# Patient Record
Sex: Male | Born: 1935 | Race: White | Hispanic: No | Marital: Married | Smoking: Current every day smoker
Health system: Southern US, Community
[De-identification: ages and names within clinical notes are randomized; demographics above are authoritative.]

## PROBLEM LIST (undated history)

## (undated) DIAGNOSIS — K635 Polyp of colon: Secondary | ICD-10-CM

## (undated) DIAGNOSIS — I4891 Unspecified atrial fibrillation: Secondary | ICD-10-CM

## (undated) DIAGNOSIS — E0590 Thyrotoxicosis, unspecified without thyrotoxic crisis or storm: Secondary | ICD-10-CM

## (undated) DIAGNOSIS — E785 Hyperlipidemia, unspecified: Secondary | ICD-10-CM

## (undated) DIAGNOSIS — Z720 Tobacco use: Secondary | ICD-10-CM

## (undated) DIAGNOSIS — Z945 Skin transplant status: Secondary | ICD-10-CM

## (undated) DIAGNOSIS — I2510 Atherosclerotic heart disease of native coronary artery without angina pectoris: Secondary | ICD-10-CM

## (undated) DIAGNOSIS — K5790 Diverticulosis of intestine, part unspecified, without perforation or abscess without bleeding: Secondary | ICD-10-CM

## (undated) DIAGNOSIS — C259 Malignant neoplasm of pancreas, unspecified: Principal | ICD-10-CM

## (undated) DIAGNOSIS — Z950 Presence of cardiac pacemaker: Secondary | ICD-10-CM

## (undated) HISTORY — PX: CERVICAL DISC SURGERY: SHX588

## (undated) HISTORY — DX: Thyrotoxicosis, unspecified without thyrotoxic crisis or storm: E05.90

## (undated) HISTORY — DX: Malignant neoplasm of pancreas, unspecified: C25.9

## (undated) HISTORY — DX: Presence of cardiac pacemaker: Z95.0

## (undated) HISTORY — DX: Hyperlipidemia, unspecified: E78.5

## (undated) HISTORY — DX: Diverticulosis of intestine, part unspecified, without perforation or abscess without bleeding: K57.90

## (undated) HISTORY — DX: Polyp of colon: K63.5

## (undated) HISTORY — PX: LUMBAR DISC SURGERY: SHX700

## (undated) HISTORY — DX: Atherosclerotic heart disease of native coronary artery without angina pectoris: I25.10

## (undated) HISTORY — DX: Tobacco use: Z72.0

## (undated) HISTORY — DX: Unspecified atrial fibrillation: I48.91

## (undated) HISTORY — DX: Skin transplant status: Z94.5

---

## 1998-04-09 ENCOUNTER — Ambulatory Visit (HOSPITAL_COMMUNITY): Admission: RE | Admit: 1998-04-09 | Discharge: 1998-04-10 | Payer: Self-pay

## 1998-04-26 ENCOUNTER — Inpatient Hospital Stay (HOSPITAL_COMMUNITY): Admission: AD | Admit: 1998-04-26 | Discharge: 1998-04-30 | Payer: Self-pay

## 1998-04-26 ENCOUNTER — Ambulatory Visit (HOSPITAL_COMMUNITY): Admission: RE | Admit: 1998-04-26 | Discharge: 1998-04-26 | Payer: Self-pay

## 1998-05-07 ENCOUNTER — Inpatient Hospital Stay (HOSPITAL_COMMUNITY): Admission: RE | Admit: 1998-05-07 | Discharge: 1998-05-14 | Payer: Self-pay

## 1999-02-28 ENCOUNTER — Emergency Department (HOSPITAL_COMMUNITY): Admission: EM | Admit: 1999-02-28 | Discharge: 1999-02-28 | Payer: Self-pay | Admitting: Emergency Medicine

## 1999-02-28 ENCOUNTER — Encounter: Payer: Self-pay | Admitting: Emergency Medicine

## 2001-01-03 ENCOUNTER — Encounter: Payer: Self-pay | Admitting: Internal Medicine

## 2001-01-04 ENCOUNTER — Inpatient Hospital Stay (HOSPITAL_COMMUNITY): Admission: RE | Admit: 2001-01-04 | Discharge: 2001-01-05 | Payer: Self-pay | Admitting: Internal Medicine

## 2001-01-04 ENCOUNTER — Encounter: Payer: Self-pay | Admitting: Internal Medicine

## 2001-01-05 ENCOUNTER — Encounter: Payer: Self-pay | Admitting: Internal Medicine

## 2001-10-21 ENCOUNTER — Encounter: Admission: RE | Admit: 2001-10-21 | Discharge: 2001-10-21 | Payer: Self-pay | Admitting: Family Medicine

## 2001-10-21 ENCOUNTER — Encounter: Payer: Self-pay | Admitting: Family Medicine

## 2002-08-02 ENCOUNTER — Encounter: Admission: RE | Admit: 2002-08-02 | Discharge: 2002-08-02 | Payer: Self-pay | Admitting: Family Medicine

## 2002-08-02 ENCOUNTER — Encounter: Payer: Self-pay | Admitting: Family Medicine

## 2003-01-05 ENCOUNTER — Encounter: Payer: Self-pay | Admitting: Family Medicine

## 2003-01-05 ENCOUNTER — Encounter: Admission: RE | Admit: 2003-01-05 | Discharge: 2003-01-05 | Payer: Self-pay | Admitting: Family Medicine

## 2004-11-11 ENCOUNTER — Ambulatory Visit: Payer: Self-pay | Admitting: Family Medicine

## 2004-11-21 ENCOUNTER — Ambulatory Visit: Payer: Self-pay

## 2004-12-17 ENCOUNTER — Ambulatory Visit: Payer: Self-pay | Admitting: Family Medicine

## 2004-12-30 ENCOUNTER — Encounter: Admission: RE | Admit: 2004-12-30 | Discharge: 2004-12-30 | Payer: Self-pay | Admitting: Family Medicine

## 2005-01-10 ENCOUNTER — Ambulatory Visit: Payer: Self-pay | Admitting: Internal Medicine

## 2005-01-13 ENCOUNTER — Ambulatory Visit: Payer: Self-pay

## 2005-01-16 ENCOUNTER — Ambulatory Visit: Payer: Self-pay | Admitting: Family Medicine

## 2005-02-18 ENCOUNTER — Ambulatory Visit: Payer: Self-pay | Admitting: Internal Medicine

## 2005-03-23 ENCOUNTER — Ambulatory Visit: Payer: Self-pay | Admitting: Internal Medicine

## 2005-04-24 ENCOUNTER — Ambulatory Visit: Payer: Self-pay | Admitting: Internal Medicine

## 2005-05-20 ENCOUNTER — Ambulatory Visit: Payer: Self-pay | Admitting: Internal Medicine

## 2005-06-22 ENCOUNTER — Ambulatory Visit: Payer: Self-pay | Admitting: Internal Medicine

## 2005-07-22 ENCOUNTER — Ambulatory Visit: Payer: Self-pay | Admitting: Internal Medicine

## 2005-08-11 ENCOUNTER — Ambulatory Visit: Payer: Self-pay | Admitting: Family Medicine

## 2005-08-13 ENCOUNTER — Ambulatory Visit: Payer: Self-pay | Admitting: Family Medicine

## 2005-08-27 ENCOUNTER — Ambulatory Visit: Payer: Self-pay | Admitting: Internal Medicine

## 2005-09-29 ENCOUNTER — Ambulatory Visit: Payer: Self-pay | Admitting: Internal Medicine

## 2005-10-17 ENCOUNTER — Ambulatory Visit: Payer: Self-pay | Admitting: Family Medicine

## 2005-11-04 ENCOUNTER — Ambulatory Visit: Payer: Self-pay | Admitting: Internal Medicine

## 2005-12-08 ENCOUNTER — Ambulatory Visit: Payer: Self-pay | Admitting: Internal Medicine

## 2005-12-21 ENCOUNTER — Ambulatory Visit: Payer: Self-pay | Admitting: Family Medicine

## 2006-01-07 ENCOUNTER — Ambulatory Visit: Payer: Self-pay | Admitting: Family Medicine

## 2006-01-11 ENCOUNTER — Ambulatory Visit: Payer: Self-pay | Admitting: Internal Medicine

## 2006-02-11 ENCOUNTER — Ambulatory Visit: Payer: Self-pay | Admitting: Internal Medicine

## 2006-03-18 ENCOUNTER — Ambulatory Visit: Payer: Self-pay | Admitting: Internal Medicine

## 2006-04-28 ENCOUNTER — Ambulatory Visit: Payer: Self-pay | Admitting: Family Medicine

## 2006-04-28 ENCOUNTER — Ambulatory Visit: Payer: Self-pay | Admitting: Internal Medicine

## 2006-05-24 ENCOUNTER — Ambulatory Visit: Payer: Self-pay

## 2006-06-25 ENCOUNTER — Ambulatory Visit: Payer: Self-pay | Admitting: Internal Medicine

## 2006-08-27 ENCOUNTER — Ambulatory Visit: Payer: Self-pay | Admitting: Internal Medicine

## 2006-09-27 ENCOUNTER — Ambulatory Visit: Payer: Self-pay | Admitting: Internal Medicine

## 2006-10-27 ENCOUNTER — Ambulatory Visit: Payer: Self-pay | Admitting: Family Medicine

## 2006-11-01 ENCOUNTER — Ambulatory Visit: Payer: Self-pay | Admitting: Internal Medicine

## 2006-11-22 ENCOUNTER — Ambulatory Visit: Payer: Self-pay | Admitting: Internal Medicine

## 2006-12-20 ENCOUNTER — Ambulatory Visit: Payer: Self-pay | Admitting: Family Medicine

## 2006-12-20 ENCOUNTER — Ambulatory Visit: Payer: Self-pay | Admitting: Internal Medicine

## 2006-12-20 ENCOUNTER — Encounter: Admission: RE | Admit: 2006-12-20 | Discharge: 2006-12-20 | Payer: Self-pay | Admitting: Family Medicine

## 2006-12-20 LAB — CONVERTED CEMR LAB
Alkaline Phosphatase: 102 units/L (ref 39–117)
BUN: 13 mg/dL (ref 6–23)
Basophils Absolute: 0.1 10*3/uL (ref 0.0–0.1)
Chol/HDL Ratio, serum: 5.4
Cholesterol: 187 mg/dL (ref 0–200)
HCT: 46.8 % (ref 39.0–52.0)
HDL: 34.5 mg/dL — ABNORMAL LOW (ref >39.0)
MCHC: 32.4 g/dL (ref 30.0–36.0)
MCV: 96.3 fL (ref 78.0–100.0)
Monocytes Relative: 10.4 % (ref 3.0–11.0)
Neutrophils Relative %: 60.2 % (ref 43.0–77.0)
PSA: 0.26 ng/mL (ref 0.10–4.00)
Platelets: 217 10*3/uL (ref 150–400)
RBC: 4.86 M/uL (ref 4.22–5.81)
Sodium: 139 meq/L (ref 135–145)
TSH: 1.25 microintl units/mL (ref 0.35–5.50)
Triglyceride fasting, serum: 61 mg/dL (ref 0–149)
Uric Acid, Serum: 6.5 mg/dL (ref 2.4–7.0)

## 2007-01-17 ENCOUNTER — Ambulatory Visit: Payer: Self-pay | Admitting: Internal Medicine

## 2007-02-15 ENCOUNTER — Ambulatory Visit: Payer: Self-pay | Admitting: Internal Medicine

## 2007-03-17 ENCOUNTER — Ambulatory Visit: Payer: Self-pay | Admitting: Internal Medicine

## 2007-03-19 ENCOUNTER — Ambulatory Visit: Payer: Self-pay | Admitting: Internal Medicine

## 2007-04-11 ENCOUNTER — Ambulatory Visit: Payer: Self-pay | Admitting: Internal Medicine

## 2007-04-13 ENCOUNTER — Ambulatory Visit: Payer: Self-pay | Admitting: Family Medicine

## 2007-04-13 ENCOUNTER — Ambulatory Visit: Payer: Self-pay | Admitting: Internal Medicine

## 2007-04-14 ENCOUNTER — Ambulatory Visit: Payer: Self-pay | Admitting: Family Medicine

## 2007-04-25 ENCOUNTER — Ambulatory Visit: Payer: Self-pay | Admitting: Family Medicine

## 2007-05-10 ENCOUNTER — Ambulatory Visit: Payer: Self-pay | Admitting: Internal Medicine

## 2007-06-06 ENCOUNTER — Ambulatory Visit: Payer: Self-pay | Admitting: Internal Medicine

## 2007-08-01 ENCOUNTER — Ambulatory Visit: Payer: Self-pay | Admitting: Internal Medicine

## 2007-08-30 ENCOUNTER — Ambulatory Visit: Payer: Self-pay | Admitting: Internal Medicine

## 2007-09-26 ENCOUNTER — Ambulatory Visit: Payer: Self-pay | Admitting: Internal Medicine

## 2007-10-05 ENCOUNTER — Ambulatory Visit: Payer: Self-pay | Admitting: Family Medicine

## 2007-10-25 ENCOUNTER — Ambulatory Visit: Payer: Self-pay | Admitting: Internal Medicine

## 2007-11-11 ENCOUNTER — Ambulatory Visit: Payer: Self-pay | Admitting: Family Medicine

## 2007-11-11 LAB — CONVERTED CEMR LAB
ALT: 13 units/L (ref 0–53)
Basophils Relative: 0.1 % (ref 0.0–1.0)
Bilirubin, Direct: 0.2 mg/dL (ref 0.0–0.3)
CO2: 28 meq/L (ref 19–32)
Eosinophils Relative: 2.1 % (ref 0.0–5.0)
GFR calc Af Amer: 85 mL/min
Glucose, Bld: 94 mg/dL (ref 70–99)
Hemoglobin: 14.1 g/dL (ref 13.0–17.0)
Ketones, urine, test strip: NEGATIVE
Lymphocytes Relative: 28.4 % (ref 12.0–46.0)
Monocytes Absolute: 1.2 10*3/uL — ABNORMAL HIGH (ref 0.2–0.7)
Neutro Abs: 6.7 10*3/uL (ref 1.4–7.7)
Nitrite: NEGATIVE
Potassium: 5 meq/L (ref 3.5–5.1)
Specific Gravity, Urine: 1.02
TSH: 1.5 microintl units/mL (ref 0.35–5.50)
Total Protein: 6.5 g/dL (ref 6.0–8.3)
VLDL: 12 mg/dL (ref 0–40)
WBC: 11.3 10*3/uL — ABNORMAL HIGH (ref 4.5–10.5)

## 2007-11-19 ENCOUNTER — Ambulatory Visit: Payer: Self-pay | Admitting: Family Medicine

## 2007-11-21 ENCOUNTER — Ambulatory Visit: Payer: Self-pay | Admitting: Family Medicine

## 2007-11-21 ENCOUNTER — Ambulatory Visit: Payer: Self-pay | Admitting: Internal Medicine

## 2007-11-21 DIAGNOSIS — E782 Mixed hyperlipidemia: Secondary | ICD-10-CM

## 2007-11-21 DIAGNOSIS — M109 Gout, unspecified: Secondary | ICD-10-CM

## 2007-11-21 DIAGNOSIS — E0590 Thyrotoxicosis, unspecified without thyrotoxic crisis or storm: Secondary | ICD-10-CM | POA: Insufficient documentation

## 2007-11-21 DIAGNOSIS — E785 Hyperlipidemia, unspecified: Secondary | ICD-10-CM | POA: Insufficient documentation

## 2007-11-21 DIAGNOSIS — F17200 Nicotine dependence, unspecified, uncomplicated: Secondary | ICD-10-CM | POA: Insufficient documentation

## 2007-11-21 DIAGNOSIS — R3129 Other microscopic hematuria: Secondary | ICD-10-CM

## 2007-12-19 ENCOUNTER — Ambulatory Visit: Payer: Self-pay | Admitting: Internal Medicine

## 2008-03-22 ENCOUNTER — Ambulatory Visit: Payer: Self-pay | Admitting: Internal Medicine

## 2008-06-06 DIAGNOSIS — M5137 Other intervertebral disc degeneration, lumbosacral region: Secondary | ICD-10-CM

## 2008-06-11 ENCOUNTER — Ambulatory Visit: Payer: Self-pay | Admitting: Family Medicine

## 2008-06-14 ENCOUNTER — Ambulatory Visit: Payer: Self-pay | Admitting: Family Medicine

## 2008-08-15 ENCOUNTER — Ambulatory Visit: Payer: Self-pay

## 2008-09-12 ENCOUNTER — Ambulatory Visit: Payer: Self-pay | Admitting: Family Medicine

## 2008-11-13 ENCOUNTER — Ambulatory Visit: Payer: Self-pay | Admitting: Internal Medicine

## 2008-12-10 ENCOUNTER — Ambulatory Visit: Payer: Self-pay | Admitting: Family Medicine

## 2008-12-10 LAB — CONVERTED CEMR LAB
ALT: 11 units/L (ref 0–53)
AST: 23 units/L (ref 0–37)
Albumin: 3.6 g/dL (ref 3.5–5.2)
Alkaline Phosphatase: 90 units/L (ref 39–117)
BUN: 8 mg/dL (ref 6–23)
Basophils Relative: 0.5 % (ref 0.0–3.0)
CO2: 31 meq/L (ref 19–32)
Chloride: 110 meq/L (ref 96–112)
Creatinine, Ser: 1.1 mg/dL (ref 0.4–1.5)
Eosinophils Relative: 1.7 % (ref 0.0–5.0)
Glucose, Bld: 102 mg/dL — ABNORMAL HIGH (ref 70–99)
Glucose, Urine, Semiquant: NEGATIVE
Lymphocytes Relative: 33 % (ref 12.0–46.0)
MCV: 96.6 fL (ref 78.0–100.0)
Monocytes Relative: 10.9 % (ref 3.0–12.0)
Neutrophils Relative %: 53.9 % (ref 43.0–77.0)
Nitrite: NEGATIVE
PSA: 0.23 ng/mL (ref 0.10–4.00)
Potassium: 4.7 meq/L (ref 3.5–5.1)
Protein, U semiquant: NEGATIVE
RBC: 4.27 M/uL (ref 4.22–5.81)
Total CHOL/HDL Ratio: 3.9
VLDL: 8 mg/dL (ref 0–40)
WBC: 8.9 10*3/uL (ref 4.5–10.5)
pH: 7

## 2008-12-20 ENCOUNTER — Encounter: Payer: Self-pay | Admitting: Internal Medicine

## 2008-12-20 ENCOUNTER — Ambulatory Visit: Payer: Self-pay | Admitting: Internal Medicine

## 2008-12-28 ENCOUNTER — Ambulatory Visit: Payer: Self-pay | Admitting: Family Medicine

## 2008-12-31 ENCOUNTER — Ambulatory Visit: Payer: Self-pay | Admitting: Family Medicine

## 2009-01-03 ENCOUNTER — Ambulatory Visit: Payer: Self-pay | Admitting: Family Medicine

## 2009-02-20 ENCOUNTER — Ambulatory Visit: Payer: Self-pay | Admitting: Internal Medicine

## 2009-02-25 DIAGNOSIS — J069 Acute upper respiratory infection, unspecified: Secondary | ICD-10-CM | POA: Insufficient documentation

## 2009-03-04 ENCOUNTER — Ambulatory Visit: Payer: Self-pay | Admitting: Family Medicine

## 2009-03-04 DIAGNOSIS — I959 Hypotension, unspecified: Secondary | ICD-10-CM | POA: Insufficient documentation

## 2009-03-12 ENCOUNTER — Encounter: Payer: Self-pay | Admitting: Internal Medicine

## 2009-03-19 ENCOUNTER — Encounter: Payer: Self-pay | Admitting: Internal Medicine

## 2009-03-19 ENCOUNTER — Ambulatory Visit: Payer: Self-pay | Admitting: Internal Medicine

## 2009-03-19 DIAGNOSIS — Z950 Presence of cardiac pacemaker: Secondary | ICD-10-CM

## 2009-03-19 DIAGNOSIS — I442 Atrioventricular block, complete: Secondary | ICD-10-CM

## 2009-03-19 DIAGNOSIS — I4891 Unspecified atrial fibrillation: Secondary | ICD-10-CM

## 2009-04-01 ENCOUNTER — Ambulatory Visit: Payer: Self-pay | Admitting: Family Medicine

## 2009-05-22 ENCOUNTER — Ambulatory Visit: Payer: Self-pay | Admitting: Internal Medicine

## 2009-08-21 ENCOUNTER — Ambulatory Visit: Payer: Self-pay | Admitting: Internal Medicine

## 2009-09-02 DIAGNOSIS — R109 Unspecified abdominal pain: Secondary | ICD-10-CM | POA: Insufficient documentation

## 2009-09-03 ENCOUNTER — Inpatient Hospital Stay (HOSPITAL_COMMUNITY): Admission: EM | Admit: 2009-09-03 | Discharge: 2009-09-07 | Payer: Self-pay | Admitting: Emergency Medicine

## 2009-09-03 ENCOUNTER — Ambulatory Visit: Payer: Self-pay | Admitting: Family Medicine

## 2009-09-25 ENCOUNTER — Encounter (INDEPENDENT_AMBULATORY_CARE_PROVIDER_SITE_OTHER): Payer: Self-pay | Admitting: *Deleted

## 2009-10-07 ENCOUNTER — Ambulatory Visit: Payer: Self-pay

## 2009-10-07 ENCOUNTER — Encounter: Payer: Self-pay | Admitting: Internal Medicine

## 2009-10-09 ENCOUNTER — Ambulatory Visit: Payer: Self-pay | Admitting: Family Medicine

## 2009-11-01 ENCOUNTER — Ambulatory Visit: Payer: Self-pay | Admitting: Family Medicine

## 2009-11-01 ENCOUNTER — Encounter: Admission: RE | Admit: 2009-11-01 | Discharge: 2009-11-01 | Payer: Self-pay | Admitting: Family Medicine

## 2009-11-01 DIAGNOSIS — J449 Chronic obstructive pulmonary disease, unspecified: Secondary | ICD-10-CM

## 2009-11-01 DIAGNOSIS — J4489 Other specified chronic obstructive pulmonary disease: Secondary | ICD-10-CM | POA: Insufficient documentation

## 2009-11-05 ENCOUNTER — Ambulatory Visit: Payer: Self-pay | Admitting: Family Medicine

## 2009-11-20 ENCOUNTER — Ambulatory Visit: Payer: Self-pay | Admitting: Internal Medicine

## 2009-11-26 ENCOUNTER — Ambulatory Visit: Payer: Self-pay | Admitting: Internal Medicine

## 2009-11-26 ENCOUNTER — Ambulatory Visit (HOSPITAL_COMMUNITY): Admission: RE | Admit: 2009-11-26 | Discharge: 2009-11-26 | Payer: Self-pay | Admitting: Family Medicine

## 2009-11-26 DIAGNOSIS — R079 Chest pain, unspecified: Secondary | ICD-10-CM | POA: Insufficient documentation

## 2009-11-27 ENCOUNTER — Telehealth: Payer: Self-pay | Admitting: Family Medicine

## 2009-11-27 ENCOUNTER — Ambulatory Visit: Payer: Self-pay | Admitting: Family Medicine

## 2009-11-27 ENCOUNTER — Telehealth (INDEPENDENT_AMBULATORY_CARE_PROVIDER_SITE_OTHER): Payer: Self-pay | Admitting: *Deleted

## 2009-12-02 ENCOUNTER — Ambulatory Visit: Payer: Self-pay

## 2009-12-02 ENCOUNTER — Ambulatory Visit: Payer: Self-pay | Admitting: Internal Medicine

## 2009-12-02 ENCOUNTER — Encounter (HOSPITAL_COMMUNITY): Admission: RE | Admit: 2009-12-02 | Discharge: 2010-02-17 | Payer: Self-pay | Admitting: Internal Medicine

## 2009-12-03 ENCOUNTER — Ambulatory Visit: Payer: Self-pay | Admitting: Family Medicine

## 2009-12-17 ENCOUNTER — Telehealth: Payer: Self-pay | Admitting: Internal Medicine

## 2009-12-17 ENCOUNTER — Ambulatory Visit: Payer: Self-pay | Admitting: Family Medicine

## 2009-12-17 LAB — CONVERTED CEMR LAB
Albumin: 3.8 g/dL (ref 3.5–5.2)
Basophils Absolute: 0.1 10*3/uL (ref 0.0–0.1)
CO2: 31 meq/L (ref 19–32)
Calcium: 9.3 mg/dL (ref 8.4–10.5)
Cholesterol: 139 mg/dL (ref 0–200)
Eosinophils Absolute: 0.2 10*3/uL (ref 0.0–0.7)
Glucose, Bld: 90 mg/dL (ref 70–99)
HCT: 42.5 % (ref 39.0–52.0)
Hemoglobin: 14 g/dL (ref 13.0–17.0)
LDL Cholesterol: 76 mg/dL (ref 0–99)
Lymphocytes Relative: 29.2 % (ref 12.0–46.0)
Lymphs Abs: 3.2 10*3/uL (ref 0.7–4.0)
MCHC: 32.8 g/dL (ref 30.0–36.0)
Monocytes Absolute: 1 10*3/uL (ref 0.1–1.0)
Neutro Abs: 6.3 10*3/uL (ref 1.4–7.7)
Nitrite: NEGATIVE
Protein, U semiquant: NEGATIVE
RDW: 14.1 % (ref 11.5–14.6)
Sodium: 142 meq/L (ref 135–145)
TSH: 1.21 microintl units/mL (ref 0.35–5.50)
Triglycerides: 81 mg/dL (ref 0.0–149.0)
Urobilinogen, UA: 0.2
VLDL: 16.2 mg/dL (ref 0.0–40.0)

## 2009-12-26 ENCOUNTER — Ambulatory Visit: Payer: Self-pay | Admitting: Internal Medicine

## 2009-12-26 LAB — CONVERTED CEMR LAB: POC INR: 1.1

## 2009-12-30 ENCOUNTER — Telehealth: Payer: Self-pay | Admitting: Family Medicine

## 2009-12-30 ENCOUNTER — Ambulatory Visit: Payer: Self-pay | Admitting: Cardiology

## 2010-01-06 ENCOUNTER — Ambulatory Visit: Payer: Self-pay | Admitting: Cardiology

## 2010-01-13 ENCOUNTER — Ambulatory Visit: Payer: Self-pay | Admitting: Cardiology

## 2010-01-20 ENCOUNTER — Ambulatory Visit: Payer: Self-pay | Admitting: Cardiovascular Disease

## 2010-01-30 ENCOUNTER — Ambulatory Visit: Payer: Self-pay | Admitting: Cardiology

## 2010-02-13 ENCOUNTER — Ambulatory Visit: Payer: Self-pay | Admitting: Cardiology

## 2010-02-19 ENCOUNTER — Ambulatory Visit: Payer: Self-pay | Admitting: Internal Medicine

## 2010-03-06 ENCOUNTER — Ambulatory Visit: Payer: Self-pay | Admitting: Cardiology

## 2010-03-06 LAB — CONVERTED CEMR LAB: POC INR: 1.9

## 2010-03-07 ENCOUNTER — Telehealth: Payer: Self-pay | Admitting: Family Medicine

## 2010-03-27 ENCOUNTER — Ambulatory Visit: Payer: Self-pay | Admitting: Internal Medicine

## 2010-03-27 LAB — CONVERTED CEMR LAB: POC INR: 2.5

## 2010-04-11 ENCOUNTER — Telehealth: Payer: Self-pay | Admitting: Family Medicine

## 2010-04-21 ENCOUNTER — Telehealth: Payer: Self-pay | Admitting: Family Medicine

## 2010-04-24 ENCOUNTER — Ambulatory Visit: Payer: Self-pay | Admitting: Internal Medicine

## 2010-05-15 ENCOUNTER — Ambulatory Visit: Payer: Self-pay | Admitting: Internal Medicine

## 2010-05-15 LAB — CONVERTED CEMR LAB: POC INR: 3

## 2010-05-21 ENCOUNTER — Ambulatory Visit: Payer: Self-pay | Admitting: Internal Medicine

## 2010-06-12 ENCOUNTER — Ambulatory Visit: Payer: Self-pay | Admitting: Cardiology

## 2010-06-12 LAB — CONVERTED CEMR LAB: POC INR: 2.4

## 2010-07-10 ENCOUNTER — Ambulatory Visit: Payer: Self-pay | Admitting: Internal Medicine

## 2010-08-01 ENCOUNTER — Telehealth: Payer: Self-pay | Admitting: Family Medicine

## 2010-08-07 ENCOUNTER — Ambulatory Visit: Payer: Self-pay | Admitting: Internal Medicine

## 2010-08-12 ENCOUNTER — Telehealth: Payer: Self-pay | Admitting: Family Medicine

## 2010-08-20 ENCOUNTER — Ambulatory Visit: Payer: Self-pay | Admitting: Internal Medicine

## 2010-09-02 ENCOUNTER — Ambulatory Visit: Payer: Self-pay | Admitting: Family Medicine

## 2010-09-08 ENCOUNTER — Ambulatory Visit: Payer: Self-pay | Admitting: Cardiovascular Disease

## 2010-10-06 ENCOUNTER — Ambulatory Visit: Payer: Self-pay | Admitting: Internal Medicine

## 2010-10-06 LAB — CONVERTED CEMR LAB: POC INR: 3.4

## 2010-10-21 ENCOUNTER — Ambulatory Visit: Payer: Self-pay | Admitting: Internal Medicine

## 2010-11-18 ENCOUNTER — Ambulatory Visit: Payer: Self-pay | Admitting: Cardiovascular Disease

## 2010-11-18 LAB — CONVERTED CEMR LAB: POC INR: 2.9

## 2010-11-19 ENCOUNTER — Ambulatory Visit: Payer: Self-pay | Admitting: Internal Medicine

## 2010-12-03 ENCOUNTER — Telehealth: Payer: Self-pay | Admitting: Family Medicine

## 2010-12-16 ENCOUNTER — Ambulatory Visit: Admission: RE | Admit: 2010-12-16 | Discharge: 2010-12-16 | Payer: Self-pay | Source: Home / Self Care

## 2010-12-16 ENCOUNTER — Telehealth: Payer: Self-pay | Admitting: Family Medicine

## 2010-12-16 LAB — CONVERTED CEMR LAB: POC INR: 2.8

## 2010-12-18 ENCOUNTER — Other Ambulatory Visit: Payer: Self-pay | Admitting: Family Medicine

## 2010-12-18 ENCOUNTER — Ambulatory Visit
Admission: RE | Admit: 2010-12-18 | Discharge: 2010-12-18 | Payer: Self-pay | Source: Home / Self Care | Attending: Family Medicine | Admitting: Family Medicine

## 2010-12-18 LAB — BASIC METABOLIC PANEL
BUN: 14 mg/dL (ref 6–23)
CO2: 28 mEq/L (ref 19–32)
Calcium: 9 mg/dL (ref 8.4–10.5)
Chloride: 101 mEq/L (ref 96–112)
Creatinine, Ser: 1 mg/dL (ref 0.4–1.5)
GFR: 77.75 mL/min (ref >60.00)
Glucose, Bld: 91 mg/dL (ref 70–99)
Potassium: 4.2 mEq/L (ref 3.5–5.1)
Sodium: 140 mEq/L (ref 135–145)

## 2010-12-18 LAB — CBC WITH DIFFERENTIAL/PLATELET
Basophils Absolute: 0.1 10*3/uL (ref 0.0–0.1)
Basophils Relative: 0.8 % (ref 0.0–3.0)
Eosinophils Absolute: 0.2 10*3/uL (ref 0.0–0.7)
Eosinophils Relative: 2.3 % (ref 0.0–5.0)
HCT: 40.9 % (ref 39.0–52.0)
Hemoglobin: 13.6 g/dL (ref 13.0–17.0)
Lymphocytes Relative: 34.8 % (ref 12.0–46.0)
Lymphs Abs: 2.7 10*3/uL (ref 0.7–4.0)
MCHC: 33.1 g/dL (ref 30.0–36.0)
MCV: 98.9 fl (ref 78.0–100.0)
Monocytes Absolute: 1 10*3/uL (ref 0.1–1.0)
Monocytes Relative: 13 % — ABNORMAL HIGH (ref 3.0–12.0)
Neutro Abs: 3.8 10*3/uL (ref 1.4–7.7)
Neutrophils Relative %: 49.1 % (ref 43.0–77.0)
Platelets: 191 10*3/uL (ref 150.0–400.0)
RBC: 4.14 Mil/uL — ABNORMAL LOW (ref 4.22–5.81)
RDW: 14.9 % — ABNORMAL HIGH (ref 11.5–14.6)
WBC: 7.8 10*3/uL (ref 4.5–10.5)

## 2010-12-18 LAB — HEPATIC FUNCTION PANEL
ALT: 30 U/L (ref 0–53)
AST: 37 U/L (ref 0–37)
Albumin: 3.7 g/dL (ref 3.5–5.2)
Alkaline Phosphatase: 101 U/L (ref 39–117)
Bilirubin, Direct: 0.2 mg/dL (ref 0.0–0.3)
Total Bilirubin: 0.6 mg/dL (ref 0.3–1.2)
Total Protein: 6.6 g/dL (ref 6.0–8.3)

## 2010-12-18 LAB — PSA: PSA: 0.39 ng/mL (ref 0.10–4.00)

## 2010-12-18 LAB — LIPID PANEL
Cholesterol: 121 mg/dL (ref 0–200)
HDL: 28.3 mg/dL — ABNORMAL LOW (ref >39.00)
LDL Cholesterol: 73 mg/dL (ref 0–99)
Total CHOL/HDL Ratio: 4
Triglycerides: 100 mg/dL (ref 0.0–149.0)
VLDL: 20 mg/dL (ref 0.0–40.0)

## 2010-12-18 LAB — URIC ACID: Uric Acid, Serum: 3.7 mg/dL — ABNORMAL LOW (ref 4.0–7.8)

## 2010-12-18 LAB — TSH: TSH: 1.76 u[IU]/mL (ref 0.35–5.50)

## 2010-12-30 ENCOUNTER — Ambulatory Visit
Admission: RE | Admit: 2010-12-30 | Discharge: 2010-12-30 | Payer: Self-pay | Source: Home / Self Care | Attending: Internal Medicine | Admitting: Internal Medicine

## 2011-01-13 NOTE — Medication Information (Signed)
Summary: rov-tp  Anticoagulant Therapy  Managed by: Shelby Dubin, PharmD, BCPS, CPP Referring MD: Kyla Balzarine MD: Jens Som MD, Arlys John Indication 1: Atrial Fibrillation Lab Used: [facility] [place] Site: [address] INR POC 1.9 INR RANGE 2-3  Dietary changes: no    Health status changes: no    Bleeding/hemorrhagic complications: no    Recent/future hospitalizations: no    Any changes in medication regimen? no    Recent/future dental: no  Any missed doses?: no       Is patient compliant with meds? yes       Allergies (verified): No Known Drug Allergies  Anticoagulation Management History:      The patient is taking warfarin and comes in today for a routine follow up visit.  Positive risk factors for bleeding include an age of 74 years or older.  The bleeding index is 'intermediate risk'.  Negative CHADS2 values include Age > 74 years old.  Anticoagulation responsible provider: Jens Som MD, Arlys John.  INR POC: 1.9.  Cuvette Lot#: [id number].  Exp: 03/2011.    Anticoagulation Management Assessment/Plan:      The patient's current anticoagulation dose is Warfarin sodium 5 mg tabs: Use as directed by Anticoagulation Clinic.  The next INR is due 01/06/2010.  Results were reviewed/authorized by Shelby Dubin, PharmD, BCPS, CPP.  He was notified by Ysidro Evert, Pharm D Candidate.         Prior Anticoagulation Instructions: Start taking warfarin as below:  Call with any questions or concerns.  Current Anticoagulation Instructions: INR: 1.9 (subtherapeutic but not at steady state) Continue with same dose of 5mg  tablet daily Recheck in 1 week

## 2011-01-13 NOTE — Medication Information (Signed)
Summary: rov/ewj  Anticoagulant Therapy  Managed by: Weston Brass, PharmD Referring MD: Kyla Balzarine MD: Shirlee Latch MD, Dalton Indication 1: Atrial Fibrillation Lab Used: [facility] [place] Site: [address] INR POC 2.4 INR RANGE 2-3  Dietary changes: no    Health status changes: no    Bleeding/hemorrhagic complications: no    Recent/future hospitalizations: no    Any changes in medication regimen? no    Recent/future dental: no  Any missed doses?: no       Is patient compliant with meds? yes       Allergies: No Known Drug Allergies  Anticoagulation Management History:      The patient is taking warfarin and comes in today for a routine follow up visit.  Positive risk factors for bleeding include an age of 23 years or older.  The bleeding index is 'intermediate risk'.  Negative CHADS2 values include Age > 11 years old.  Anticoagulation responsible provider: Shirlee Latch MD, Dalton.  INR POC: 2.4.  Cuvette Lot#: [id number].  Exp: 08/2011.    Anticoagulation Management Assessment/Plan:      The patient's current anticoagulation dose is Warfarin sodium 5 mg tabs: Use as directed by Anticoagulation Clinic.  The target INR is 2.0-3.0.  The next INR is due 07/10/2010.  Anticoagulation instructions were given to patient.  Results were reviewed/authorized by Weston Brass, PharmD.  He was notified by Weston Brass PharmD.         Prior Anticoagulation Instructions: INR 3.0  Start taking 1 tablet daily except 1/2 tablet on Mondays, Wednesdays, and Fridays.  Recheck in 4 weeks.    Current Anticoagulation Instructions: INR 2.4  Continue same dose of 1 tablet every day except 1/2 tablet on Monday, Wednesday and Friday.

## 2011-01-13 NOTE — Cardiovascular Report (Signed)
Summary: TTM   TTM   Imported By: Roderic Ovens 09/03/2010 11:50:55  _____________________________________________________________________  External Attachment:    Type:   Image     Comment:   External Document

## 2011-01-13 NOTE — Medication Information (Signed)
Summary: rov/sp  Anticoagulant Therapy  Managed by: Cloyde Reams, RN, BSN Referring MD: Kyla Balzarine MD: Graciela Husbands MD, Viviann Spare Indication 1: Atrial Fibrillation Lab Used: [facility] [place] Site: [address] INR POC 3.0 INR RANGE 2-3  Dietary changes: no    Health status changes: no    Bleeding/hemorrhagic complications: yes       Details: episodic gum bleeding after brushing teeth  Recent/future hospitalizations: no    Any changes in medication regimen? no    Recent/future dental: no  Any missed doses?: no       Is patient compliant with meds? yes       Allergies (verified): No Known Drug Allergies  Anticoagulation Management History:      The patient is taking warfarin and comes in today for a routine follow up visit.  Positive risk factors for bleeding include an age of 74 years or older.  The bleeding index is 'intermediate risk'.  Negative CHADS2 values include Age > 69 years old.  Anticoagulation responsible provider: Graciela Husbands MD, Viviann Spare.  INR POC: 3.0.  Cuvette Lot#: [id number].  Exp: 07/2011.    Anticoagulation Management Assessment/Plan:      The patient's current anticoagulation dose is Warfarin sodium 5 mg tabs: Use as directed by Anticoagulation Clinic.  The target INR is 2.0-3.0.  The next INR is due 06/12/2010.  Anticoagulation instructions were given to patient.  Results were reviewed/authorized by Cloyde Reams, RN, BSN.  He was notified by Cloyde Reams RN.         Prior Anticoagulation Instructions: INR 3.2  Take 1/2 tablet today then resume same dose of 1 tablet every day except 1/2 tablet on Monday and Friday   Current Anticoagulation Instructions: INR 3.0  Start taking 1 tablet daily except 1/2 tablet on Mondays, Wednesdays, and Fridays.  Recheck in 4 weeks.

## 2011-01-13 NOTE — Progress Notes (Signed)
Summary: [facility]  Phone Note Call from Patient   Summary of Call: patient would like all of his meds sent [facility] Initial call taken by: Kern Reap CMA Duncan Dull),  December 30, 2009 10:49 AM    Prescriptions: WARFARIN SODIUM 5 MG TABS (WARFARIN SODIUM) Use as directed by Anticoagulation Clinic  #30 x 1   Entered by:   Kern Reap CMA (AAMA)   Authorized by:   Roderick Pee MD   Signed by:   Kern Reap CMA (AAMA) on 12/30/2009   Method used:   Faxed to ...       [facility])       [address] [address]       [place], [place]  [address]       Ph: [phone]       Fax: [phone]   RxID:   [id number] FLEXERIL 10 MG  TABS (CYCLOBENZAPRINE HCL) Take 1 tablet by mouth three times a day  #30 x 1   Entered by:   Kern Reap CMA (AAMA)   Authorized by:   Roderick Pee MD   Signed by:   Kern Reap CMA (AAMA) on 12/30/2009   Method used:   Faxed to ...       [facility])       [address] [address]       [place], [place]  [address]       Ph: [phone]       Fax: [phone]   RxID:   [id number] NITROSTAT 0.4 MG  SUBL (NITROGLYCERIN) as needed  #25 x 1   Entered by:   Kern Reap CMA (AAMA)   Authorized by:   Roderick Pee MD   Signed by:   Kern Reap CMA (AAMA) on 12/30/2009   Method used:   Faxed to ...       [facility])       [address] [address]       [place], [place]  [address]       Ph: [phone]       Fax: [phone]   RxID:   [id number] ALLOPURINOL 300 MG TABS (ALLOPURINOL) qd  #100 x 4   Entered by:   Kern Reap CMA (AAMA)   Authorized by:   Roderick Pee MD   Signed by:   Kern Reap CMA (AAMA) on 12/30/2009   Method used:   Faxed to ...       [facility])       [address] [address]       [place], [place]  [address]       Ph: [phone]       Fax: [phone]   RxID:   [id number] COLCHICINE 0.6 MG TABS (COLCHICINE) 1 by mouth once daily  #100 x 4   Entered  by:   Kern Reap CMA (AAMA)   Authorized by:   Roderick Pee MD   Signed by:   Kern Reap CMA (AAMA) on 12/30/2009   Method used:   Faxed to ...       [facility])       [address] [address]       [place], [place]  [address]       Ph: [phone]       Fax: [phone]   RxID:   [id number] ZANTAC 150 MG CAPS (RANITIDINE HCL) 1 by mouth once daily  #100 x 4   Entered by:   Kern Reap  CMA (AAMA)   Authorized by:   Roderick Pee MD   Signed by:   Kern Reap CMA (AAMA) on 12/30/2009   Method used:   Faxed to ...       [facility])       [address] [address]       [place], [place]  [address]       Ph: [phone]       Fax: [phone]   RxID:   [id number] ZOCOR 40 MG TABS (SIMVASTATIN) 1 by mouth once daily  #100 x 4   Entered by:   Kern Reap CMA (AAMA)   Authorized by:   Roderick Pee MD   Signed by:   Kern Reap CMA (AAMA) on 12/30/2009   Method used:   Faxed to ...       [facility])       [address] [address]       [place], [place]  [address]       Ph: [phone]       Fax: [phone]   RxID:   [id number]

## 2011-01-13 NOTE — Progress Notes (Signed)
Summary: question about medication  Phone Note Call from Patient Call back at Home Phone [phone]   Caller: Patient Complaint: Urinary/GYN Problems Summary of Call: pt have question about medications Initial call taken by: Judie Grieve,  December 17, 2009 12:55 PM  Follow-up for Phone Call        Pt saw his PCP today, Dr. Tawanna Cooler and he noticed that Dr. Graciela Husbands had commented at his last office visit that he needed to be on Coumadin but it had been stopped by Dr. Tawanna Cooler and he wasn't sure why. Dr. Tawanna Cooler ask him to call us and see if he should be restarted because he has no knowledge of him being on it or stopping it. Per Dr. Graciela Husbands pt will need to start Coumadin. Pt transferred to [facility] to get set up with coumadin clinic. Follow-up by: Duncan Dull, RN, BSN,  December 17, 2009 3:16 PM

## 2011-01-13 NOTE — Cardiovascular Report (Signed)
Summary: Office Visit   Office Visit   Imported By: Roderic Ovens 03/05/2010 16:17:32  _____________________________________________________________________  External Attachment:    Type:   Image     Comment:   External Document

## 2011-01-13 NOTE — Medication Information (Signed)
Summary: [name]  Anticoagulant Therapy  Managed by: Bethena Midget, RN, BSN Referring MD: Kyla Balzarine MD: Graciela Husbands MD, Viviann Spare Indication 1: Atrial Fibrillation Lab Used: [facility] [place] Site: [address] INR POC 2.5 INR RANGE 2-3  Dietary changes: no    Health status changes: no    Bleeding/hemorrhagic complications: no    Recent/future hospitalizations: no    Any changes in medication regimen? no    Recent/future dental: no  Any missed doses?: no       Is patient compliant with meds? yes       Allergies: No Known Drug Allergies  Anticoagulation Management History:      The patient is taking warfarin and comes in today for a routine follow up visit.  Positive risk factors for bleeding include an age of 67 years or older.  The bleeding index is 'intermediate risk'.  Negative CHADS2 values include Age > 17 years old.  Anticoagulation responsible provider: Graciela Husbands MD, Viviann Spare.  INR POC: 2.5.  Cuvette Lot#: [id number].  Exp: 09/2011.    Anticoagulation Management Assessment/Plan:      The patient's current anticoagulation dose is Warfarin sodium 5 mg tabs: Use as directed by Anticoagulation Clinic.  The target INR is 2.0-3.0.  The next INR is due 09/04/2010.  Anticoagulation instructions were given to patient.  Results were reviewed/authorized by Bethena Midget, RN, BSN.  He was notified by Bethena Midget, RN, BSN.         Prior Anticoagulation Instructions: INR 1.9  Take 1.5 tablets today, then resume same dosage 1 tablet daily except 1/2 tablet on Mondays, Wednesdays, and Fridays.  Recheck in 4 weeks.     Current Anticoagulation Instructions: INR 2.5 Continue 1 pill everyday except 1/2 pill on Mondays, Wednesdays and Fridays. Recheck in 4 weeks.

## 2011-01-13 NOTE — Progress Notes (Signed)
Summary: zantac change  Phone Note From Pharmacy   Summary of Call: zantac 150 is not available in capsule is this okay to change to ranitidine 150 tab? Initial call taken by: Kern Reap CMA Duncan Dull),  Apr 21, 2010 11:50 AM  Follow-up for Phone Call        ok Follow-up by: Roderick Pee MD,  Apr 21, 2010 12:08 PM    New/Updated Medications: RANITIDINE HCL 150 MG TABS (RANITIDINE HCL) take one tab by mouth once daily Prescriptions: RANITIDINE HCL 150 MG TABS (RANITIDINE HCL) take one tab by mouth once daily  #90 x 3   Entered by:   Kern Reap CMA (AAMA)   Authorized by:   Roderick Pee MD   Signed by:   Kern Reap CMA (AAMA) on 04/21/2010   Method used:   Faxed to ...       [facility])       [address] [address]       [place], [place]  [address]       Ph: [phone]       Fax: [phone]   RxID:   [id number] RANITIDINE HCL 150 MG TABS (RANITIDINE HCL) take one tab by mouth once daily  #90 x 3   Entered by:   Kern Reap CMA (AAMA)   Authorized by:   Roderick Pee MD   Signed by:   Kern Reap CMA (AAMA) on 04/21/2010   Method used:   Print then Give to Patient   RxID:   [id number]

## 2011-01-13 NOTE — Progress Notes (Signed)
Summary: rx -denied  Phone Note Refill Request Message from:  Fax from Pharmacy on August 01, 2010 5:31 PM  Refills Requested: Medication #1:  HYDROMET 5-1.5 MG/5ML SYRP 1 or 2 tsps at bedtime as needed Initial call taken by: Kern Reap CMA Duncan Dull),  August 01, 2010 5:31 PM    denied needs an office visit Kern Reap CMA Duncan Dull)  August 01, 2010 5:31 PM

## 2011-01-13 NOTE — Medication Information (Signed)
Summary: rov/tm  Anticoagulant Therapy  Managed by: Bethena Midget, RN, BSN Referring MD: Kyla Balzarine MD: Excell Seltzer MD, Casimiro Needle Indication 1: Atrial Fibrillation Lab Used: [facility] [place] Site: [address] INR POC 2.2 INR RANGE 2-3  Dietary changes: no    Health status changes: no    Bleeding/hemorrhagic complications: no    Recent/future hospitalizations: no    Any changes in medication regimen? no    Recent/future dental: no  Any missed doses?: no       Is patient compliant with meds? yes       Allergies: No Known Drug Allergies  Anticoagulation Management History:      The patient is taking warfarin and comes in today for a routine follow up visit.  Positive risk factors for bleeding include an age of 39 years or older.  The bleeding index is 'intermediate risk'.  Negative CHADS2 values include Age > 72 years old.  Anticoagulation responsible provider: Excell Seltzer MD, Casimiro Needle.  INR POC: 2.2.  Cuvette Lot#: [id number].  Exp: 03/2011.    Anticoagulation Management Assessment/Plan:      The patient's current anticoagulation dose is Warfarin sodium 5 mg tabs: Use as directed by Anticoagulation Clinic.  The next INR is due 01/30/2010.  Anticoagulation instructions were given to patient.  Results were reviewed/authorized by Bethena Midget, RN, BSN.  He was notified by Bethena Midget, RN, BSN.         Prior Anticoagulation Instructions: INR 4.9 Skip today's dose then change dose to 2.5mg s everyday except 5mg s on Tuesdays, Thursdays and Sundays.  Recheck in one week.   Current Anticoagulation Instructions: INR 2.2 Continue 1/2 pill everyday except 1 pill on Tuesdays, Thursdays and Sundays. Recheck in 7-10 days.

## 2011-01-13 NOTE — Medication Information (Signed)
Summary: rov/tm  Anticoagulant Therapy  Managed by: Weston Brass, PharmD Referring MD: Kyla Balzarine MD: Excell Seltzer MD, Casimiro Needle Indication 1: Atrial Fibrillation Lab Used: [facility] [place] Site: [address] INR POC 2.8 INR RANGE 2-3  Dietary changes: no    Health status changes: no    Bleeding/hemorrhagic complications: no    Recent/future hospitalizations: no    Any changes in medication regimen? no    Recent/future dental: no  Any missed doses?: no       Is patient compliant with meds? yes       Allergies: No Known Drug Allergies  Anticoagulation Management History:      The patient is taking warfarin and comes in today for a routine follow up visit.  Positive risk factors for bleeding include an age of 74 years or older.  The bleeding index is 'intermediate risk'.  Negative CHADS2 values include Age > 13 years old.  Anticoagulation responsible Demarkis Gheen: Excell Seltzer MD, Casimiro Needle.  INR POC: 2.8.  Cuvette Lot#: [id number].  Exp: 10/2011.    Anticoagulation Management Assessment/Plan:      The patient's current anticoagulation dose is Warfarin sodium 5 mg tabs: Use as directed by Anticoagulation Clinic.  The target INR is 2.0-3.0.  The next INR is due 10/06/2010.  Anticoagulation instructions were given to patient.  Results were reviewed/authorized by Weston Brass, PharmD.  He was notified by Harrel Carina, PharmD candidate.         Prior Anticoagulation Instructions: INR 2.5 Continue 1 pill everyday except 1/2 pill on Mondays, Wednesdays and Fridays. Recheck in 4 weeks.   Current Anticoagulation Instructions: INR 2.8  Continue taking 1 tablet everyday except take 1/2 tablet on Mondays, Wednesdays, and Fridays. Re-check INR in 4 weeks.

## 2011-01-13 NOTE — Progress Notes (Signed)
Summary: refill  Phone Note Refill Request Call back at Home Phone [phone] Message from:  Patient---walk in  Refills Requested: Medication #1:  HYDROMET 5-1.5 MG/5ML SYRP 1 or 2 tsps at bedtime as needed Please send to [facility]  Initial call taken by: Warnell Forester,  August 12, 2010 8:39 AM  Follow-up for Phone Call        Hydromet 8 ounces directions one half to 1 teaspoon nightly p.r.n. cough one refill Follow-up by: Roderick Pee MD,  August 12, 2010 8:44 AM  Additional Follow-up for Phone Call Additional follow up Details #1::        Rx called to pharmacy Additional Follow-up by: Kern Reap CMA Duncan Dull),  August 12, 2010 9:11 AM

## 2011-01-13 NOTE — Medication Information (Signed)
Summary: rov/ewj  Anticoagulant Therapy  Managed by: Weston Brass, PharmD Referring MD: Kyla Balzarine MD: Johney Frame MD, Fayrene Fearing Indication 1: Atrial Fibrillation Lab Used: [facility] [place] Site: [address] INR POC 3.2 INR RANGE 2-3  Dietary changes: no    Health status changes: no    Bleeding/hemorrhagic complications: yes       Details: scant amount of bright red blood after BM- was constipated and only happened that one occassion  Recent/future hospitalizations: no    Any changes in medication regimen? no    Recent/future dental: no  Any missed doses?: no       Is patient compliant with meds? yes       Allergies: No Known Drug Allergies  Anticoagulation Management History:      The patient is taking warfarin and comes in today for a routine follow up visit.  Positive risk factors for bleeding include an age of 74 years or older.  The bleeding index is 'intermediate risk'.  Negative CHADS2 values include Age > 19 years old.  Anticoagulation responsible provider: Charlean Carneal MD, Fayrene Fearing.  INR POC: 3.2.  Cuvette Lot#: [id number].  Exp: 07/2011.    Anticoagulation Management Assessment/Plan:      The patient's current anticoagulation dose is Warfarin sodium 5 mg tabs: Use as directed by Anticoagulation Clinic.  The target INR is 2.0-3.0.  The next INR is due 05/15/2010.  Anticoagulation instructions were given to patient.  Results were reviewed/authorized by Weston Brass, PharmD.  He was notified by Weston Brass PharmD.         Prior Anticoagulation Instructions: INR 2.5  Continue on same dosage 1 tablet daily except 1/2 tablet on Mondays and Fridays.  Recheck in 4 weeks.    Current Anticoagulation Instructions: INR 3.2  Take 1/2 tablet today then resume same dose of 1 tablet every day except 1/2 tablet on Monday and Friday

## 2011-01-13 NOTE — Medication Information (Signed)
Summary: rov/ez  Anticoagulant Therapy  Managed by: Bethena Midget, RN, BSN Referring MD: Kyla Balzarine MD: Shirlee Latch MD, Tinesha Siegrist Indication 1: Atrial Fibrillation Lab Used: [facility] [place] Site: [address] INR POC 4.9 INR RANGE 2-3  Dietary changes: no    Health status changes: no    Bleeding/hemorrhagic complications: no    Recent/future hospitalizations: no    Any changes in medication regimen? no    Recent/future dental: no  Any missed doses?: no       Is patient compliant with meds? yes       Allergies (verified): No Known Drug Allergies  Anticoagulation Management History:      The patient is taking warfarin and comes in today for a routine follow up visit.  Positive risk factors for bleeding include an age of 25 years or older.  The bleeding index is 'intermediate risk'.  Negative CHADS2 values include Age > 65 years old.  Anticoagulation responsible provider: Shirlee Latch MD, Shaterria Sager.  INR POC: 4.9.  Cuvette Lot#: [id number].  Exp: 03/2011.    Anticoagulation Management Assessment/Plan:      The patient's current anticoagulation dose is Warfarin sodium 5 mg tabs: Use as directed by Anticoagulation Clinic.  The next INR is due 01/20/2010.  Anticoagulation instructions were given to patient.  Results were reviewed/authorized by Bethena Midget, RN, BSN.  He was notified by Bethena Midget, RN, BSN.         Prior Anticoagulation Instructions: INR: 3.0 Take 1/2 tablet today, then decrease dose to 5mg  tablet daily except 1/2 tablet on Mondays Recheck in 1 week   Current Anticoagulation Instructions: INR 4.9 Skip today's dose then change dose to 2.5mg s everyday except 5mg s on Tuesdays, Thursdays and Sundays.  Recheck in one week.  Prescriptions: WARFARIN SODIUM 5 MG TABS (WARFARIN SODIUM) Use as directed by Anticoagulation Clinic  #30 x 3   Entered by:   Tiffany Muse, RN, BSN   Authorized by:   Steven Cochran Klein, MD, FACC   Signed by:   Tiffany Muse, RN,  BSN on 01/13/2010   Method used:   Electronically to        [facility] [id number] (retail)       [address]       [place], [place]  [address]       Ph: [phone] or [phone]       Fax: [phone]   RxID:   [id number]

## 2011-01-13 NOTE — Progress Notes (Signed)
Summary: re fax rx  Phone Note Call from Patient   Summary of Call: escript did not receive refills Initial call taken by: Kern Reap CMA Duncan Dull),  March 07, 2010 3:57 PM    Prescriptions: WARFARIN SODIUM 5 MG TABS (WARFARIN SODIUM) Use as directed by Anticoagulation Clinic  #100 x 1   Entered by:   Kern Reap CMA (AAMA)   Authorized by:   Roderick Pee MD   Signed by:   Kern Reap CMA (AAMA) on 03/07/2010   Method used:   Faxed to ...       [facility])       [address] [address]       [place], [place]  [address]       Ph: [phone]       Fax: [phone]   RxID:   [id number] NITROSTAT 0.4 MG  SUBL (NITROGLYCERIN) as needed  #25 x 1   Entered by:   Kern Reap CMA (AAMA)   Authorized by:   Roderick Pee MD   Signed by:   Kern Reap CMA (AAMA) on 03/07/2010   Method used:   Faxed to ...       [facility])       [address] [address]       [place], [place]  [address]       Ph: [phone]       Fax: [phone]   RxID:   [id number] ALLOPURINOL 300 MG TABS (ALLOPURINOL) qd  #100 x 4   Entered by:   Kern Reap CMA (AAMA)   Authorized by:   Roderick Pee MD   Signed by:   Kern Reap CMA (AAMA) on 03/07/2010   Method used:   Faxed to ...       [facility])       [address] [address]       [place], [place]  [address]       Ph: [phone]       Fax: [phone]   RxID:   [id number] COLCHICINE 0.6 MG TABS (COLCHICINE) 1 by mouth once daily  #100 x 4   Entered by:   Kern Reap CMA (AAMA)   Authorized by:   Roderick Pee MD   Signed by:   Kern Reap CMA (AAMA) on 03/07/2010   Method used:   Faxed to ...       [facility])       [address] [address]       [place], [place]  [address]       Ph: [phone]       Fax: [phone]   RxID:   [id number] ZOCOR 40 MG TABS (SIMVASTATIN) 1 by mouth once daily  #100 x 4   Entered by:   Kern Reap CMA (AAMA)   Authorized by:   Roderick Pee MD   Signed by:    Kern Reap CMA (AAMA) on 03/07/2010   Method used:   Faxed to ...       [facility] education [facility])       [address] [address]       [place], [place]  [address]       Ph: [phone]       Fax: [phone]   RxID:   [id number]

## 2011-01-13 NOTE — Cardiovascular Report (Signed)
Summary: TTM   TTM   Imported By: Roderic Ovens 06/23/2010 14:51:53  _____________________________________________________________________  External Attachment:    Type:   Image     Comment:   External Document

## 2011-01-13 NOTE — Medication Information (Signed)
Summary: rov/tm  Anticoagulant Therapy  Managed by: Cloyde Reams, RN, BSN Referring MD: Kyla Balzarine MD: Myrtis Ser MD, Tinnie Gens Indication 1: Atrial Fibrillation Lab Used: [facility] [place] Site: [address] INR POC 1.9 INR RANGE 2-3   Health status changes: yes       Details: c/o constipation, some blood on tissue when wipes d/t.  Bleeding/hemorrhagic complications: no    Recent/future hospitalizations: no    Any changes in medication regimen? no    Recent/future dental: no  Any missed doses?: no       Is patient compliant with meds? yes      Comments: c/o being tired.    Allergies (verified): No Known Drug Allergies  Anticoagulation Management History:      The patient is taking warfarin and comes in today for a routine follow up visit.  Positive risk factors for bleeding include an age of 74 years or older.  The bleeding index is 'intermediate risk'.  Negative CHADS2 values include Age > 19 years old.  Anticoagulation responsible provider: Myrtis Ser MD, Tinnie Gens.  INR POC: 1.9.  Cuvette Lot#: [id number].  Exp: 03/2011.    Anticoagulation Management Assessment/Plan:      The patient's current anticoagulation dose is Warfarin sodium 5 mg tabs: Use as directed by Anticoagulation Clinic.  The target INR is 2.0-3.0.  The next INR is due 02/13/2010.  Anticoagulation instructions were given to patient.  Results were reviewed/authorized by Cloyde Reams, RN, BSN.  He was notified by Cloyde Reams RN.         Prior Anticoagulation Instructions: INR 2.2 Continue 1/2 pill everyday except 1 pill on Tuesdays, Thursdays and Sundays. Recheck in 7-10 days.   Current Anticoagulation Instructions: INR 1.9  Start taking 1 tablet daily except 1/2 tablet on Mondays, Wednesdays, and Fridays.  Recheck in 2 weeks.
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
## 2011-01-13 NOTE — Medication Information (Signed)
Summary: rov/tm  Anticoagulant Therapy  Managed by: Bethena Midget, RN, BSN Referring MD: Kyla Balzarine MD: Eden Emms MD, Theron Arista Indication 1: Atrial Fibrillation Lab Used: [facility] [place] Site: [address] INR POC 2.9 INR RANGE 2-3  Dietary changes: no    Health status changes: no    Bleeding/hemorrhagic complications: no    Recent/future hospitalizations: no    Any changes in medication regimen? no    Recent/future dental: no  Any missed doses?: no       Is patient compliant with meds? yes       Allergies: No Known Drug Allergies  Anticoagulation Management History:      The patient is taking warfarin and comes in today for a routine follow up visit.  Positive risk factors for bleeding include an age of 74 years or older.  The bleeding index is 'intermediate risk'.  Negative CHADS2 values include Age > 57 years old.  Anticoagulation responsible provider: Eden Emms MD, Theron Arista.  INR POC: 2.9.  Cuvette Lot#: [id number].  Exp: 09/2011.    Anticoagulation Management Assessment/Plan:      The patient's current anticoagulation dose is Warfarin sodium 5 mg tabs: Use as directed by Anticoagulation Clinic.  The target INR is 2.0-3.0.  The next INR is due 12/16/2010.  Anticoagulation instructions were given to patient.  Results were reviewed/authorized by Bethena Midget, RN, BSN.  He was notified by Bethena Midget, RN, BSN.         Prior Anticoagulation Instructions: INR 2.5 Continue 1 pill everyday except 1/2 pill on Mondays, Wednesdays and Fridays. Recheck in 4 weeks.   Current Anticoagulation Instructions: INR 2.9 Continue 1 pill everyday except 1/2 pill on Mondays, Wednesdays and Fridays. Recheck in 4 weeks.

## 2011-01-13 NOTE — Assessment & Plan Note (Signed)
Summary: flu-shot/rcd  Nurse Visit   Review of Systems       Flu Vaccine Consent Questions     Do you have a history of severe allergic reactions to this vaccine? no    Any prior history of allergic reactions to egg and/or gelatin? no    Do you have a sensitivity to the preservative Thimersol? no    Do you have a past history of Guillan-Barre Syndrome? no    Do you currently have an acute febrile illness? no    Have you ever had a severe reaction to latex? no    Vaccine information given and explained to patient? yes    Are you currently pregnant? no    Lot Number:[id number]   Exp Date:06/13/2011   Site Given  Left Deltoid IM    Allergies: No Known Drug Allergies  Orders Added: 1)  Flu Vaccine 57yrs + MEDICARE PATIENTS [Q2039] 2)  Administration Flu vaccine - MCR [G0008]

## 2011-01-13 NOTE — Progress Notes (Signed)
Summary: rx for cough, congestion  Phone Note Call from Patient   Caller: Patient Reason for Call: Acute Illness, Refill Medication Details for Reason: rx refill for cough and congestion Summary of Call: patient is calling because he has some head congestion and a dry cough with no fever.  he would like to know if it is possible for a refill of prednisone 20mg  and hydromet cough syrup.       [facility] Initial call taken by: Kern Reap CMA Duncan Dull),  April 11, 2010 10:16 AM  Follow-up for Phone Call        may refill once but needs prompt f/u with Dr Tawanna Cooler if any fever, dyspnea, or not improving over the next few days. Follow-up by: Evelena Peat MD,  April 11, 2010 12:30 PM    New/Updated Medications: PREDNISONE 20 MG TABS (PREDNISONE) use as directed Prescriptions: PREDNISONE 20 MG TABS (PREDNISONE) use as directed  #60 x 0   Entered by:   Kern Reap CMA (AAMA)   Authorized by:   Roderick Pee MD   Signed by:   Kern Reap CMA (AAMA) on 04/11/2010   Method used:   Electronically to        [facility] (retail)       [address]       [place], [place]  [address]       Ph: [phone] or [phone]       Fax: [phone]   RxID:   [id number] WARFARIN SODIUM 5 MG TABS (WARFARIN SODIUM) Use as directed by Anticoagulation Clinic  #100 x 0   Entered by:   Kern Reap CMA (AAMA)   Authorized by:   Roderick Pee MD   Signed by:   Kern Reap CMA (AAMA) on 04/11/2010   Method used:   Electronically to        [facility] (retail)       [address]       [place], [place]  [address]       Ph: [phone] or [phone]       Fax: [phone]   RxID:   [id number]

## 2011-01-13 NOTE — Progress Notes (Signed)
Summary: refills  Phone Note Refill Request Message from:  Patient on April 11, 2010 11:41 AM  Refills Requested: Medication #1:  ZANTAC 150 MG CAPS 1 by mouth once daily  Medication #2:  FLEXERIL 10 MG  TABS Take 1 tablet by mouth three times a day Initial call taken by: Kern Reap CMA Duncan Dull),  April 11, 2010 11:44 AM    Prescriptions: FLEXERIL 10 MG  TABS (CYCLOBENZAPRINE HCL) Take 1 tablet by mouth three times a day  #270 x 3   Entered by:   Kern Reap CMA (AAMA)   Authorized by:   Roderick Pee MD   Signed by:   Kern Reap CMA (AAMA) on 04/11/2010   Method used:   Faxed to ...       [facility])       [address] [address]       [place], [place]  [address]       Ph: [phone]       Fax: [phone]   RxID:   [id number] ZANTAC 150 MG CAPS (RANITIDINE HCL) 1 by mouth once daily  #100 x 4   Entered by:   Kern Reap CMA (AAMA)   Authorized by:   Roderick Pee MD   Signed by:   Kern Reap CMA (AAMA) on 04/11/2010   Method used:   Faxed to ...       [facility])       [address] [address]       [place], [place]  [address]       Ph: [phone]       Fax: [phone]   RxID:   [id number]

## 2011-01-13 NOTE — Medication Information (Signed)
Summary: rov/tm  Anticoagulant Therapy  Managed by: Cloyde Reams, RN, BSN Referring MD: Kyla Balzarine MD: Daleen Squibb MD, Maisie Fus Indication 1: Atrial Fibrillation Lab Used: [facility] [place] Site: [address] INR POC 1.9 INR RANGE 2-3  Dietary changes: no    Health status changes: no    Bleeding/hemorrhagic complications: no    Recent/future hospitalizations: no    Any changes in medication regimen? no    Recent/future dental: no  Any missed doses?: no       Is patient compliant with meds? yes      Comments: May have to have tooth extracted will call after sees DDS.  Allergies (verified): No Known Drug Allergies  Anticoagulation Management History:      The patient is taking warfarin and comes in today for a routine follow up visit.  Positive risk factors for bleeding include an age of 35 years or older.  The bleeding index is 'intermediate risk'.  Negative CHADS2 values include Age > 89 years old.  Anticoagulation responsible provider: Daleen Squibb MD, Maisie Fus.  INR POC: 1.9.  Cuvette Lot#: [id number].  Exp: 04/2011.    Anticoagulation Management Assessment/Plan:      The patient's current anticoagulation dose is Warfarin sodium 5 mg tabs: Use as directed by Anticoagulation Clinic.  The target INR is 2.0-3.0.  The next INR is due 03/27/2010.  Anticoagulation instructions were given to patient.  Results were reviewed/authorized by Cloyde Reams, RN, BSN.  He was notified by Cloyde Reams RN.         Prior Anticoagulation Instructions: INR 2.6 Continue 1 pill everyday except 1/2 pill on Mondays, Wednesdays and Fridays. Recheck in 3 weeks.   Current Anticoagulation Instructions: INR 1.9  Take 1.5 tablets tonight, then start taking 1 tablet daily except 1/2 tablet on Mondays and Fridays.  Recheck in 3 weeks.    Appended Document: rov/tm    Prescriptions: WARFARIN SODIUM 5 MG TABS (WARFARIN SODIUM) Use as directed by Anticoagulation Clinic  #100 x 1   Entered  by:   Cloyde Reams RN   Authorized by:   Nathen May, MD, [facility]   Signed by:   Cloyde Reams RN on 03/06/2010   Method used:   Electronically to        [facility] (retail)       [address]       [place], [place]  [address]       Ph: [phone] or [phone]       Fax: [phone]   RxID:   [id number]

## 2011-01-13 NOTE — Medication Information (Signed)
Summary: rov/jaj  Anticoagulant Therapy  Managed by: Bethena Midget, RN, BSN Referring MD: Kyla Balzarine MD: Johney Frame MD, Fayrene Fearing Indication 1: Atrial Fibrillation Lab Used: [facility] [place] Site: [address] INR POC 3.4 INR RANGE 2-3  Dietary changes: no    Health status changes: no    Bleeding/hemorrhagic complications: no    Recent/future hospitalizations: no    Any changes in medication regimen? no    Recent/future dental: no  Any missed doses?: no       Is patient compliant with meds? yes      Comments: Pt states he has been taking 1 pill daily except 1.5 pills on MWF for months.   Allergies: No Known Drug Allergies  Anticoagulation Management History:      The patient is taking warfarin and comes in today for a routine follow up visit.  Positive risk factors for bleeding include an age of 40 years or older.  The bleeding index is 'intermediate risk'.  Negative CHADS2 values include Age > 72 years old.  Anticoagulation responsible Jesiah Grismer: Allred MD, Fayrene Fearing.  INR POC: 3.4.  Cuvette Lot#: [id number].  Exp: 11/2011.    Anticoagulation Management Assessment/Plan:      The patient's current anticoagulation dose is Warfarin sodium 5 mg tabs: Use as directed by Anticoagulation Clinic.  The target INR is 2.0-3.0.  The next INR is due 10/21/2010.  Anticoagulation instructions were given to patient.  Results were reviewed/authorized by Bethena Midget, RN, BSN.  He was notified by Bethena Midget, RN, BSN.         Prior Anticoagulation Instructions: INR 2.8  Continue taking 1 tablet everyday except take 1/2 tablet on Mondays, Wednesdays, and Fridays. Re-check INR in 4 weeks.   Current Anticoagulation Instructions: INR 3.4 Skip today's dose then change dose to  1 pill everyday except 1 1/2  pills on Mondays and Fridays. Recheck in 2 weeks.   Appended Document: rov/jaj Telephoned office and stated that he reviewed his dosage and he has been taking 1 pill daily  except 1/2 on MWF. Thus, instructed to continue that dose.   Appended Document: rov/jaj    Clinical Lists Changes  Observations: Changed observation from FRIDAY DOSE: 1.5 tabs (10/06/2010 9:51) to FRIDAY DOSE: 1/2 tab (10/06/2010 9:51) Changed observation from [facility] DOSE: 1 tab (10/06/2010 9:51) to [facility] DOSE: 1/2 tab (10/06/2010 9:51) Changed observation from [facility] DOSE: 1.5 tabs (10/06/2010 9:51) to MONDAY DOSE: 1/2 tab (10/06/2010 9:51)

## 2011-01-13 NOTE — Medication Information (Signed)
Summary: rov/sp  Anticoagulant Therapy  Managed by: Cloyde Reams, RN, BSN Referring MD: Kyla Balzarine MD: Gala Romney MD, Reuel Boom Indication 1: Atrial Fibrillation Lab Used: [facility] [place] Site: [address] INR POC 1.9 INR RANGE 2-3  Dietary changes: no    Health status changes: no    Bleeding/hemorrhagic complications: no    Recent/future hospitalizations: no    Any changes in medication regimen? no    Recent/future dental: no  Any missed doses?: yes     Details: Missed 1 dosage approx 3 weeks ago.   Is patient compliant with meds? yes       Allergies: No Known Drug Allergies  Anticoagulation Management History:      The patient is taking warfarin and comes in today for a routine follow up visit.  Positive risk factors for bleeding include an age of 74 years or older.  The bleeding index is 'intermediate risk'.  Negative CHADS2 values include Age > 12 years old.  Anticoagulation responsible provider: Bensimhon MD, Reuel Boom.  INR POC: 1.9.  Cuvette Lot#: [id number].  Exp: 09/2011.    Anticoagulation Management Assessment/Plan:      The patient's current anticoagulation dose is Warfarin sodium 5 mg tabs: Use as directed by Anticoagulation Clinic.  The target INR is 2.0-3.0.  The next INR is due 08/07/2010.  Anticoagulation instructions were given to patient.  Results were reviewed/authorized by Cloyde Reams, RN, BSN.  He was notified by Cloyde Reams RN.         Prior Anticoagulation Instructions: INR 2.4  Continue same dose of 1 tablet every day except 1/2 tablet on Monday, Wednesday and Friday.    Current Anticoagulation Instructions: INR 1.9  Take 1.5 tablets today, then resume same dosage 1 tablet daily except 1/2 tablet on Mondays, Wednesdays, and Fridays.  Recheck in 4 weeks.

## 2011-01-13 NOTE — Medication Information (Signed)
Summary: rov/ewj  Anticoagulant Therapy  Managed by: Cloyde Reams, RN, BSN Referring MD: Kyla Balzarine MD: Gala Romney MD, Reuel Boom Indication 1: Atrial Fibrillation Lab Used: [facility] [place] Site: [address] INR POC 2.5 INR RANGE 2-3  Dietary changes: no    Health status changes: no    Bleeding/hemorrhagic complications: no    Recent/future hospitalizations: no    Any changes in medication regimen? no    Recent/future dental: no  Any missed doses?: no       Is patient compliant with meds? yes       Allergies (verified): No Known Drug Allergies  Anticoagulation Management History:      The patient is taking warfarin and comes in today for a routine follow up visit.  Positive risk factors for bleeding include an age of 28 years or older.  The bleeding index is 'intermediate risk'.  Negative CHADS2 values include Age > 36 years old.  Anticoagulation responsible provider: Clyda Smyth MD, Reuel Boom.  INR POC: 2.5.  Cuvette Lot#: [id number].  Exp: 04/2011.    Anticoagulation Management Assessment/Plan:      The patient's current anticoagulation dose is Warfarin sodium 5 mg tabs: Use as directed by Anticoagulation Clinic.  The target INR is 2.0-3.0.  The next INR is due 04/24/2010.  Anticoagulation instructions were given to patient.  Results were reviewed/authorized by Cloyde Reams, RN, BSN.  He was notified by Cloyde Reams RN.         Prior Anticoagulation Instructions: INR 1.9  Take 1.5 tablets tonight, then start taking 1 tablet daily except 1/2 tablet on Mondays and Fridays.  Recheck in 3 weeks.    Current Anticoagulation Instructions: INR 2.5  Continue on same dosage 1 tablet daily except 1/2 tablet on Mondays and Fridays.  Recheck in 4 weeks.

## 2011-01-13 NOTE — Medication Information (Signed)
Summary: rov/ewj  Anticoagulant Therapy  Managed by: Bethena Midget, RN, BSN Referring MD: Kyla Balzarine MD: Jens Som MD, Arlys John Indication 1: Atrial Fibrillation Lab Used: [facility] [place] Site: [address] INR POC 2.6 INR RANGE 2-3  Dietary changes: no    Health status changes: no    Bleeding/hemorrhagic complications: no    Recent/future hospitalizations: no    Any changes in medication regimen? no    Recent/future dental: no  Any missed doses?: no       Is patient compliant with meds? yes       Allergies: No Known Drug Allergies  Anticoagulation Management History:      The patient is taking warfarin and comes in today for a routine follow up visit.  Positive risk factors for bleeding include an age of 74 years or older.  The bleeding index is 'intermediate risk'.  Negative CHADS2 values include Age > 11 years old.  Anticoagulation responsible provider: Jens Som MD, Arlys John.  INR POC: 2.6.  Cuvette Lot#: [id number].  Exp: 04/2011.    Anticoagulation Management Assessment/Plan:      The patient's current anticoagulation dose is Warfarin sodium 5 mg tabs: Use as directed by Anticoagulation Clinic.  The target INR is 2.0-3.0.  The next INR is due 03/06/2010.  Anticoagulation instructions were given to patient.  Results were reviewed/authorized by Bethena Midget, RN, BSN.  He was notified by Bethena Midget, RN, BSN.         Prior Anticoagulation Instructions: INR 1.9  Start taking 1 tablet daily except 1/2 tablet on Mondays, Wednesdays, and Fridays.  Recheck in 2 weeks.    Current Anticoagulation Instructions: INR 2.6 Continue 1 pill everyday except 1/2 pill on Mondays, Wednesdays and Fridays. Recheck in 3 weeks.

## 2011-01-13 NOTE — Medication Information (Signed)
Summary: rov/ez  Anticoagulant Therapy  Managed by: Cloyde Reams, RN Referring MD: Kyla Balzarine MD: Antoine Poche MD, Fayrene Fearing Indication 1: Atrial Fibrillation Lab Used: [facility] [place] Site: [address] INR POC 3.0 INR RANGE 2-3  Dietary changes: no    Health status changes: no    Bleeding/hemorrhagic complications: no    Recent/future hospitalizations: no    Any changes in medication regimen? no    Recent/future dental: no  Any missed doses?: no       Is patient compliant with meds? yes       Current Medications (verified): 1)  Zocor 40 Mg Tabs (Simvastatin) .Marland Kitchen.. 1 By Mouth Once Daily 2)  Zantac 150 Mg Caps (Ranitidine Hcl) .Marland Kitchen.. 1 By Mouth Once Daily 3)  Colchicine 0.6 Mg Tabs (Colchicine) .Marland Kitchen.. 1 By Mouth Once Daily 4)  Allopurinol 300 Mg Tabs (Allopurinol) .... Qd 5)  Ocuvite   Tabs (Multiple Vitamins-Minerals) .Marland Kitchen.. 1 Qd 6)  Aspir-Low 81 Mg Tbec (Aspirin) .Marland Kitchen.. 1 Once Daily Pc 7)  Bl Vitamin E 400 Unit  Caps (Vitamin E) .Marland Kitchen.. 1 By Mouth Once Daily 8)  Nitrostat 0.4 Mg  Subl (Nitroglycerin) .... As Needed 9)  Flexeril 10 Mg  Tabs (Cyclobenzaprine Hcl) .... Take 1 Tablet By Mouth Three Times A Day 10)  Hydromet 5-1.5 Mg/75ml Syrp (Hydrocodone-Homatropine) .Marland Kitchen.. 1 or 2 Tsps At Bedtime As Needed 11)  Warfarin Sodium 5 Mg Tabs (Warfarin Sodium) .... Use As Directed By Anticoagulation Clinic  Allergies (verified): No Known Drug Allergies  Anticoagulation Management History:      The patient is taking warfarin and comes in today for a routine follow up visit.  Positive risk factors for bleeding include an age of 74 years or older.  The bleeding index is 'intermediate risk'.  Negative CHADS2 values include Age > 74 years old.  Anticoagulation responsible provider: Antoine Poche MD, Fayrene Fearing.  INR POC: 3.0.  Cuvette Lot#: [id number].  Exp: 03/2011.    Anticoagulation Management Assessment/Plan:      The patient's current anticoagulation dose is Warfarin sodium 5 mg  tabs: Use as directed by Anticoagulation Clinic.  The next INR is due 01/13/2010.  Anticoagulation instructions were given to patient.  Results were reviewed/authorized by Cloyde Reams, RN.  He was notified by Ysidro Evert, Pharm D Candidate.         Prior Anticoagulation Instructions: INR: 1.9 (subtherapeutic but not at steady state) Continue with same dose of 5mg  tablet daily Recheck in 1 week   Current Anticoagulation Instructions: INR: 3.0 Take 1/2 tablet today, then decrease dose to 5mg  tablet daily except 1/2 tablet on Mondays Recheck in 1 week

## 2011-01-13 NOTE — Medication Information (Signed)
Summary: to start meds per melanie/afib./saf  Anticoagulant Therapy  Managed by: Charolotte Eke, PharmD Referring MD: Kyla Balzarine MD: Ladona Ridgel MD, Sharlot Gowda Indication 1: Atrial Fibrillation Lab Used: [facility] [place] Site: [address] INR POC 1.1 INR RANGE 2-3  Dietary changes: no    Health status changes: no    Bleeding/hemorrhagic complications: no    Recent/future hospitalizations: no    Any changes in medication regimen? no    Recent/future dental: no  Any missed doses?: no       Is patient compliant with meds? yes      Comments: Hasn't started Coumadin yet.  Current Medications (verified): 1)  Zocor 40 Mg Tabs (Simvastatin) .Marland Kitchen.. 1 By Mouth Once Daily 2)  Zantac 150 Mg Caps (Ranitidine Hcl) .Marland Kitchen.. 1 By Mouth Once Daily 3)  Colchicine 0.6 Mg Tabs (Colchicine) .Marland Kitchen.. 1 By Mouth Once Daily 4)  Allopurinol 300 Mg Tabs (Allopurinol) .... Qd 5)  Ocuvite   Tabs (Multiple Vitamins-Minerals) .Marland Kitchen.. 1 Qd 6)  Aspir-Low 81 Mg Tbec (Aspirin) .Marland Kitchen.. 1 Once Daily Pc 7)  Bl Vitamin E 400 Unit  Caps (Vitamin E) .Marland Kitchen.. 1 By Mouth Once Daily 8)  Nitrostat 0.4 Mg  Subl (Nitroglycerin) .... As Needed 9)  Flexeril 10 Mg  Tabs (Cyclobenzaprine Hcl) .... Take 1 Tablet By Mouth Three Times A Day 10)  Hydromet 5-1.5 Mg/71ml Syrp (Hydrocodone-Homatropine) .Marland Kitchen.. 1 or 2 Tsps At Bedtime As Needed  Allergies (verified): No Known Drug Allergies  Anticoagulation Management History:      The patient comes in today for his initial visit for anticoagulation therapy.  Positive risk factors for bleeding include an age of 74 years or older.  The bleeding index is 'intermediate risk'.  Negative CHADS2 values include Age > 74 years old.  Anticoagulation responsible provider: Ladona Ridgel MD, Sharlot Gowda.  INR POC: 1.1.  Cuvette Lot#: [id number].  Exp: 03/15/2011.    Anticoagulation Management Assessment/Plan:      The patient's current anticoagulation dose is Warfarin sodium 5 mg tabs: Use as directed by  Anticoagulation Clinic.  The next INR is due 12/30/2009.  Results were reviewed/authorized by Charolotte Eke, PharmD.  He was notified by Charolotte Eke, PharmD.         Current Anticoagulation Instructions: Start taking warfarin as below:  Call with any questions or concerns. Prescriptions: WARFARIN SODIUM 5 MG TABS (WARFARIN SODIUM) Use as directed by Anticoagulation Clinic  #30 x 1   Entered by:   Charolotte Eke, PharmD   Authorized by:   Nathen May, MD, [facility]   Signed by:   Charolotte Eke, PharmD on 12/26/2009   Method used:   Electronically to        [facility] (retail)       [address]       [place], [place]  [address]       Ph: [phone] or [phone]       Fax: [phone]   RxID:   [id number]

## 2011-01-13 NOTE — Medication Information (Signed)
Summary: rov/tm  Anticoagulant Therapy  Managed by: Bethena Midget, RN, BSN Referring MD: Kyla Balzarine MD: Graciela Husbands MD, Viviann Spare Indication 1: Atrial Fibrillation Lab Used: [facility] [place] Site: [address] INR POC 2.5 INR RANGE 2-3  Dietary changes: no    Health status changes: no    Bleeding/hemorrhagic complications: no    Recent/future hospitalizations: no    Any changes in medication regimen? no    Recent/future dental: no  Any missed doses?: no       Is patient compliant with meds? yes      Comments: Pt stated that he had stated his dose incorrectly last visit, he is taking 1 pill once daily and 1/2 pill on MWF,  Allergies: No Known Drug Allergies  Anticoagulation Management History:      The patient is taking warfarin and comes in today for a routine follow up visit.  Positive risk factors for bleeding include an age of 74 years or older.  The bleeding index is 'intermediate risk'.  Negative CHADS2 values include Age > 59 years old.  Anticoagulation responsible provider: Graciela Husbands MD, Viviann Spare.  INR POC: 2.5.  Cuvette Lot#: [id number].  Exp: 10/2011.    Anticoagulation Management Assessment/Plan:      The patient's current anticoagulation dose is Warfarin sodium 5 mg tabs: Use as directed by Anticoagulation Clinic.  The target INR is 2.0-3.0.  The next INR is due 11/18/2010.  Anticoagulation instructions were given to patient.  Results were reviewed/authorized by Bethena Midget, RN, BSN.  He was notified by Bethena Midget, RN, BSN.         Prior Anticoagulation Instructions: INR 3.4 Skip today's dose then change dose to  1 pill everyday except 1 1/2  pills on Mondays and Fridays. Recheck in 2 weeks.   Current Anticoagulation Instructions: INR 2.5 Continue 1 pill everyday except 1/2 pill on Mondays, Wednesdays and Fridays. Recheck in 4 weeks.

## 2011-01-13 NOTE — Cardiovascular Report (Signed)
Summary: TTM   TTM   Imported By: Roderic Ovens 01/03/2010 14:57:57  _____________________________________________________________________  External Attachment:    Type:   Image     Comment:   External Document

## 2011-01-15 NOTE — Cardiovascular Report (Signed)
Summary: TTM   TTM   Imported By: Roderic Ovens 11/28/2010 15:11:59  _____________________________________________________________________  External Attachment:    Type:   Image     Comment:   External Document
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
## 2011-01-15 NOTE — Medication Information (Signed)
Summary: rov/tp  Anticoagulant Therapy  Managed by: Geoffry Paradise, PharmD Referring MD: Kyla Balzarine MD: Jens Som MD, Arlys John Indication 1: Atrial Fibrillation Lab Used: [facility] [place] Site: [address] INR POC 2.8 INR RANGE 2-3  Dietary changes: no    Health status changes: no    Bleeding/hemorrhagic complications: no    Recent/future hospitalizations: no    Any changes in medication regimen? no    Recent/future dental: no  Any missed doses?: no       Is patient compliant with meds? yes       Allergies: No Known Drug Allergies  Anticoagulation Management History:      Positive risk factors for bleeding include an age of 57 years or older.  The bleeding index is 'intermediate risk'.  Negative CHADS2 values include Age > 83 years old.  Anticoagulation responsible provider: Jens Som MD, Arlys John.  INR POC: 2.8.  Exp: 09/2011.    Anticoagulation Management Assessment/Plan:      The patient's current anticoagulation dose is Warfarin sodium 5 mg tabs: Use as directed by Anticoagulation Clinic.  The target INR is 2.0-3.0.  The next INR is due 01/16/2011.  Anticoagulation instructions were given to patient.  Results were reviewed/authorized by Geoffry Paradise, PharmD.         Prior Anticoagulation Instructions: INR 2.9 Continue 1 pill everyday except 1/2 pill on Mondays, Wednesdays and Fridays. Recheck in 4 weeks.   Current Anticoagulation Instructions: INR:  2.8  Your INR is at goal today.  Please continue taking Coumadin 1/2 a tablet Monday, Wednesday, Friday and 1 tablet other days of the week.  Return to clinic in 4 weeks for another INR check.

## 2011-01-15 NOTE — Progress Notes (Signed)
Summary: MED REFILL  Phone Note Refill Request   Refills Requested: Medication #1:  HYDROMET 5-1.5 MG/5ML SYRP 1 or 2 tsps at bedtime as needed PT WOULD LIKE REFILL  Initial call taken by: Heron Sabins,  December 03, 2010 9:55 AM  Follow-up for Phone Call        Hydromet 4 ounces directions one half to 1 teaspoon 3 times a day p.r.n. cough, cold, one refill Follow-up by: Roderick Pee MD,  December 03, 2010 10:09 AM  Additional Follow-up for Phone Call Additional follow up Details #1::        called into [facility] [place] Additional Follow-up by: Kern Reap CMA Duncan Dull),  December 03, 2010 1:06 PM
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
## 2011-01-15 NOTE — Progress Notes (Signed)
Summary: Call-A-Nurse Report    Call-A-Nurse Triage Call Report Triage Record Num: [id number] Operator: Martie Lee Long Patient Name: [name] Call Date & Time: 12/13/2010 3:41:36PM Patient Phone: [phone] PCP: Patient Gender: Male PCP Fax : Patient DOB: [birth year] [facility] Name: [facility] Reason for Call: Lonnie/Wife calling about rt hip and leg pain. Pt thinks he pulled a muscle.Pt had a "catch" when he bent over to tie his shoe. Onset 12/13/10 @ 0930. Instructed to be seen within 2weeks by Provider. Protocol(s) Used: Muscle Pain Recommended Outcome per Protocol: See Provider within 2 Weeks Reason for Outcome: Localized muscle pain AND joint ache or pain Care Advice: If provider has previously given recommendations for similar pain, follow same; if no relief or if symptoms recur, call provider.  ~ Apply a cloth-covered cold or ice pack to the area for 20 minutes 4 to 8 times a day for relief of pain for the first 24-48 hours. After 24 to 48 hours of cold application, use a cloth-covered heat pack to the area for 20 minutes 3 to 4 times a day.  ~  ~ SYMPTOM / CONDITION MANAGEMENT Analgesic/Antipyretic Advice - Acetaminophen: Consider acetaminophen as directed on label or by pharmacist/provider for pain or fever PRECAUTIONS: - Use if there is no history of liver disease, alcoholism, or intake of three or more alcohol drinks per day - Only if approved by provider during pregnancy or when breastfeeding - During pregnancy, acetaminophen should not be taken more than 3 consecutive days without telling provider - Do not exceed recommended dose or frequency  ~ 12/13/2010 4:13:07PM Page 1 of 1 [email or web address]_TriageRpt_[email or web address] Call-A-Nurse Triage Call Report Triage Record Num: [id number] Operator: Martie Lee Long Patient Name: [name] Call Date & Time: 12/13/2010 3:41:36PM Patient Phone: [phone] PCP: Patient Gender: Male PCP Fax : Patient DOB: [birth year] [facility] Name[facility] Reason for Call: Lonnie/Wife calling about rt hip and leg pain. Pt thinks he pulled a muscle.Pt had a "catch" when he bent over to tie his shoe. Onset 12/13/10 @ 0930. Instructed to be seen within 2weeks by Provider. Protocol(s) Used: Hip Non-Injury Recommended Outcome per Protocol: Provide Home/Self Care Reason for Outcome: New pain, aching, cramping or stiffness following exercise/activity that was not part of regular routine or was excessive for individual Care Advice: Apply a warm or cool compress, or a cloth-covered ice pack, whichever feels better, to the area for 20 minutes every 2-3 hours while awake to relieve discomfort.  ~ Analgesic/Antipyretic Advice - Acetaminophen: Consider acetaminophen as directed on label or by pharmacist/provider for pain or fever PRECAUTIONS: - Use if there is no history of liver disease, alcoholism, or intake of three or more alcohol drinks per day - Only if approved by provider during pregnancy or when breastfeeding - During pregnancy, acetaminophen should not be taken more than 3 consecutive days without telling provider - Do not exceed recommended dose or frequency  ~ 12/13/2010 4:13:08PM Page 1 of 1 [email or web address]_TriageRpt_V2

## 2011-01-16 ENCOUNTER — Ambulatory Visit: Admit: 2011-01-16 | Payer: Self-pay

## 2011-01-16 ENCOUNTER — Encounter (INDEPENDENT_AMBULATORY_CARE_PROVIDER_SITE_OTHER): Payer: Medicare Other

## 2011-01-16 ENCOUNTER — Encounter: Payer: Self-pay | Admitting: Cardiology

## 2011-01-16 DIAGNOSIS — I4891 Unspecified atrial fibrillation: Secondary | ICD-10-CM

## 2011-01-16 DIAGNOSIS — Z7901 Long term (current) use of anticoagulants: Secondary | ICD-10-CM

## 2011-01-16 LAB — CONVERTED CEMR LAB: POC INR: 2.9

## 2011-01-21 NOTE — Medication Information (Signed)
Summary: [name]  Anticoagulant Therapy  Managed by: Bethena Midget, RN, BSN Referring MD: Kyla Balzarine MD: Jens Som MD, Arlys John Indication 1: Atrial Fibrillation Lab Used: [facility] [place] Site: [address] INR POC 2.9 INR RANGE 2-3  Dietary changes: no    Health status changes: no    Bleeding/hemorrhagic complications: no    Recent/future hospitalizations: no    Any changes in medication regimen? yes       Details: ASA discontinued by Dr Graciela Husbands  Recent/future dental: no  Any missed doses?: no       Is patient compliant with meds? yes       Allergies (verified): No Known Drug Allergies  Anticoagulation Management History:      The patient is taking warfarin and comes in today for a routine follow up visit.  Positive risk factors for bleeding include an age of 74 years or older.  The bleeding index is 'intermediate risk'.  Negative CHADS2 values include Age > 74 years old.  Anticoagulation responsible provider: Jens Som MD, Arlys John.  INR POC: 2.9.  Cuvette Lot#: [id number].  Exp: 12/2011.    Anticoagulation Management Assessment/Plan:      The patient's current anticoagulation dose is Warfarin sodium 5 mg tabs: Use as directed by Anticoagulation Clinic.  The target INR is 2.0-3.0.  The next INR is due 02/13/2011.  Anticoagulation instructions were given to patient.  Results were reviewed/authorized by Bethena Midget, RN, BSN.  He was notified by Bethena Midget, RN, BSN.         Prior Anticoagulation Instructions: INR:  2.8  Your INR is at goal today.  Please continue taking Coumadin 1/2 a tablet Monday, Wednesday, Friday and 1 tablet other days of the week.  Return to clinic in 4 weeks for another INR check.    Current Anticoagulation Instructions: INR 2.9 Continue 1 pill everyday except 1/2 pill on Mondays, Wednesdays and Fridays. Recheck in 4 weeks.  Prescriptions: WARFARIN SODIUM 5 MG TABS (WARFARIN SODIUM) Use as directed by Anticoagulation Clinic   #100 x 4   Entered by:   Bethena Midget, RN, BSN   Authorized by:   Nathen May, MD, [facility]   Signed by:   Bethena Midget, RN, BSN on 01/16/2011   Method used:   Faxed to ...       [facility] Environmental education officer)       [address] [address]       [place], [place]  [address]       Ph: [phone]       Fax: [phone]   RxID:   [id number]

## 2011-01-23 ENCOUNTER — Other Ambulatory Visit: Payer: Self-pay | Admitting: *Deleted

## 2011-01-23 DIAGNOSIS — M791 Myalgia, unspecified site: Secondary | ICD-10-CM

## 2011-01-23 DIAGNOSIS — K219 Gastro-esophageal reflux disease without esophagitis: Secondary | ICD-10-CM

## 2011-01-23 DIAGNOSIS — E785 Hyperlipidemia, unspecified: Secondary | ICD-10-CM

## 2011-01-23 DIAGNOSIS — I4891 Unspecified atrial fibrillation: Secondary | ICD-10-CM

## 2011-01-23 DIAGNOSIS — I2510 Atherosclerotic heart disease of native coronary artery without angina pectoris: Secondary | ICD-10-CM

## 2011-01-23 MED ORDER — WARFARIN SODIUM 5 MG PO TABS: 5.0000 mg | ORAL_TABLET | Freq: Every day | ORAL | Status: DC

## 2011-01-23 MED ORDER — CYCLOBENZAPRINE HCL 10 MG PO TABS: 10.0000 mg | ORAL_TABLET | Freq: Three times a day (TID) | ORAL | Status: AC | PRN

## 2011-01-23 MED ORDER — ALLOPURINOL 300 MG PO TABS: 300.0000 mg | ORAL_TABLET | Freq: Every day | ORAL | Status: DC

## 2011-01-23 MED ORDER — COLCHICINE 0.6 MG PO TABS: 0.6000 mg | ORAL_TABLET | Freq: Every day | ORAL | Status: DC

## 2011-01-23 MED ORDER — NITROGLYCERIN 0.4 MG SL SUBL: 0.4000 mg | SUBLINGUAL_TABLET | SUBLINGUAL | Status: AC | PRN

## 2011-01-23 MED ORDER — RANITIDINE HCL 150 MG PO CAPS: 150.0000 mg | ORAL_CAPSULE | Freq: Two times a day (BID) | ORAL | Status: DC

## 2011-01-23 MED ORDER — SIMVASTATIN 40 MG PO TABS: 40.0000 mg | ORAL_TABLET | Freq: Every day | ORAL | Status: DC

## 2011-01-23 NOTE — Telephone Encounter (Signed)
rx sent to [facility]

## 2011-01-27 DIAGNOSIS — I4891 Unspecified atrial fibrillation: Secondary | ICD-10-CM

## 2011-02-13 ENCOUNTER — Encounter: Payer: Self-pay | Admitting: Cardiovascular Disease

## 2011-02-13 ENCOUNTER — Encounter (INDEPENDENT_AMBULATORY_CARE_PROVIDER_SITE_OTHER): Payer: Medicare Other

## 2011-02-13 DIAGNOSIS — Z7901 Long term (current) use of anticoagulants: Secondary | ICD-10-CM

## 2011-02-13 DIAGNOSIS — I4891 Unspecified atrial fibrillation: Secondary | ICD-10-CM

## 2011-02-19 NOTE — Medication Information (Signed)
Summary: rov/tm  Anticoagulant Therapy  Managed by: Bayard Hugger, PharmD Referring MD: Kyla Balzarine MD: Clifton James MD, Cristal Deer Indication 1: Atrial Fibrillation Lab Used: [facility] [place] Site: [address] INR POC 2.9 INR RANGE 2-3  Dietary changes: no    Health status changes: no    Bleeding/hemorrhagic complications: no    Recent/future hospitalizations: no    Any changes in medication regimen? no    Recent/future dental: no  Any missed doses?: no       Is patient compliant with meds? yes       Allergies: No Known Drug Allergies  Anticoagulation Management History:      Positive risk factors for bleeding include an age of 22 years or older.  The bleeding index is 'intermediate risk'.  Negative CHADS2 values include Age > 50 years old.  Anticoagulation responsible provider: Clifton James MD, Cristal Deer.  INR POC: 2.9.  Cuvette Lot#: [id number].  Exp: 12/2011.    Anticoagulation Management Assessment/Plan:      The patient's current anticoagulation dose is Warfarin sodium 5 mg tabs: Use as directed by Anticoagulation Clinic.  The target INR is 2.0-3.0.  The next INR is due 03/16/2011.  Anticoagulation instructions were given to patient.  Results were reviewed/authorized by Bayard Hugger, PharmD.         Prior Anticoagulation Instructions: INR 2.9 Continue 1 pill everyday except 1/2 pill on Mondays, Wednesdays and Fridays. Recheck in 4 weeks.   Current Anticoagulation Instructions: INR 2.9 Continue taking current dose: 1 tablet every day except Mon, Wed, Fri take 1/2 tablet Recheck INR on April 2nd

## 2011-03-16 ENCOUNTER — Ambulatory Visit (INDEPENDENT_AMBULATORY_CARE_PROVIDER_SITE_OTHER): Payer: Medicare Other | Admitting: *Deleted

## 2011-03-16 DIAGNOSIS — I4891 Unspecified atrial fibrillation: Secondary | ICD-10-CM

## 2011-03-16 LAB — POCT INR: INR: 2.1

## 2011-03-16 NOTE — Patient Instructions (Signed)
INR 2.1  Continue same dose of 1 tablet every day except 1/2 tablet on Monday, Wednesday and Friday.  Recheck INR in 4 weeks.

## 2011-03-17 LAB — BLOOD GAS, ARTERIAL
Acid-Base Excess: 1.7 mmol/L (ref 0.0–2.0)
Bicarbonate: 25 mEq/L — ABNORMAL HIGH (ref 20.0–24.0)
FIO2: 0.21 %
O2 Saturation: 96.4 %
Patient temperature: 98.6

## 2011-03-18 ENCOUNTER — Other Ambulatory Visit: Payer: Self-pay | Admitting: *Deleted

## 2011-03-18 MED ORDER — HYDROCODONE-ACETAMINOPHEN 7.5-325 MG PO TABS: ORAL_TABLET | ORAL | Status: DC

## 2011-03-20 LAB — COMPREHENSIVE METABOLIC PANEL
ALT: 12 U/L (ref 0–53)
ALT: 14 U/L (ref 0–53)
AST: 25 U/L (ref 0–37)
Albumin: 3.2 g/dL — ABNORMAL LOW (ref 3.5–5.2)
Albumin: 4.6 g/dL (ref 3.5–5.2)
Alkaline Phosphatase: 125 U/L — ABNORMAL HIGH (ref 39–117)
Calcium: 8.6 mg/dL (ref 8.4–10.5)
Calcium: 9.8 mg/dL (ref 8.4–10.5)
GFR calc Af Amer: >60 mL/min (ref >60)
Glucose, Bld: 126 mg/dL — ABNORMAL HIGH (ref 70–99)
Potassium: 5 mEq/L (ref 3.5–5.1)
Sodium: 136 mEq/L (ref 135–145)
Sodium: 140 mEq/L (ref 135–145)
Total Protein: 6.1 g/dL (ref 6.0–8.3)
Total Protein: 8.6 g/dL — ABNORMAL HIGH (ref 6.0–8.3)

## 2011-03-20 LAB — URINALYSIS, ROUTINE W REFLEX MICROSCOPIC
Glucose, UA: NEGATIVE mg/dL
pH: 6.5 (ref 5.0–8.0)

## 2011-03-20 LAB — CBC
HCT: 50.4 % (ref 39.0–52.0)
MCHC: 33.3 g/dL (ref 30.0–36.0)
MCHC: 33.7 g/dL (ref 30.0–36.0)
Platelets: 187 10*3/uL (ref 150–400)
Platelets: 203 10*3/uL (ref 150–400)
RDW: 14.2 % (ref 11.5–15.5)
RDW: 14.8 % (ref 11.5–15.5)

## 2011-03-20 LAB — URINE MICROSCOPIC-ADD ON

## 2011-03-20 LAB — DIFFERENTIAL
Eosinophils Absolute: 0 10*3/uL (ref 0.0–0.7)
Lymphs Abs: 1.5 10*3/uL (ref 0.7–4.0)
Monocytes Absolute: 0.7 10*3/uL (ref 0.1–1.0)
Monocytes Relative: 5 % (ref 3–12)
Neutro Abs: 12.8 10*3/uL — ABNORMAL HIGH (ref 1.7–7.7)
Neutrophils Relative %: 85 % — ABNORMAL HIGH (ref 43–77)

## 2011-04-15 ENCOUNTER — Ambulatory Visit (INDEPENDENT_AMBULATORY_CARE_PROVIDER_SITE_OTHER): Payer: Medicare Other | Admitting: *Deleted

## 2011-04-15 DIAGNOSIS — I4891 Unspecified atrial fibrillation: Secondary | ICD-10-CM

## 2011-04-15 LAB — POCT INR: INR: 2.3

## 2011-04-24 ENCOUNTER — Encounter: Payer: Self-pay | Admitting: Internal Medicine

## 2011-04-24 DIAGNOSIS — I4892 Unspecified atrial flutter: Secondary | ICD-10-CM

## 2011-04-28 NOTE — Assessment & Plan Note (Signed)
[facility]                         ELECTROPHYSIOLOGY OFFICE NOTE   NAME:[name], [name]                        MRN:          [id number]  DATE:08/30/2007                            DOB:          1935-06-14    The patient comes in for follow-up for his pacemaker following AV  junction ablation for uncontrolled atrial arrhythmias.  He continues to  feel really quite well with no problems with exercise tolerance.   Notably he remains not on Coumadin and has not been for some years.   PHYSICAL EXAMINATION:  VITAL SIGNS:  Blood pressure 118/73, pulse 78.  LUNGS:  Clear.  HEART:  Sounds were regular.  EXTREMITIES:  Without edema.   Interrogation of his [employer] Integrity pulse generator demonstrates an  R wave of 10 with impedance of 390, threshold of 0.875 at 0.4.   Auto capture is on.   Heart rate excursion is adequate.   IMPRESSION:  1. Complete heart block status post AV junction ablation.  2. Status post pacemaker.  3. Atrial fibrillation - permanent.  4. Left ventricular dysfunction, improved.  5. No Coumadin therapy.   The patient continues with atrial fibrillation.  He has lone atrial  fibrillation and is tolerating his device well.  We will see him again  in one years' time.     Duke Salvia, MD, [facility]: [facility] W  Electronically Signed    SCK/MedQ  DD: 08/30/2007  DT: 08/30/2007  Job #: [id number]

## 2011-05-01 NOTE — Op Note (Signed)
[place]. [facility]  Patient:    [name], [name]                        MRN: [id number] Proc. Date: 01/03/01 Adm. Date:  [id number] Attending:  Nathen May CC:         Maricela Bo, M.D.   Operative Report  OPTICAL DISK:  159A  PREOPERATIVE DIAGNOSIS:  Atrial fibrillation with tachycardia bradycardia and uncontrolled ventricular response.  POSTOPERATIVE DIAGNOSIS:  Atrial fibrillation with tachycardia bradycardia and uncontrolled ventricular response.  OPERATION:  Contrast tomography and pacing implantation with anticipated AV junctional ablation.  SURGEON:  Nathen May, M.D. [facility]  INFORMED CONSENT:  Following the attainment of informed consent [name] was brought to the electrophysiology laboratory and placed on the fluoroscopic table in supine position.  After routine prep and drape of the left upper chest, intravenous contrast was ejected via the left antecubital vein to identify the course and the patency of the extrathoracic left subclavian vein. This having been accomplished Lidocaine was infiltrated in the prepectoral subclavicular region and an incision was made and carried down to the layer of the prepectoral fascia using electrocautery.  A pocket was formed using electrocautery.  Hemostasis was obtained.  Thereafter attention was turned to gain access to the extrathoracic left subclavian vein which was accomplished with modest difficulty but without the aspiration or air or puncturing the artery.  A guidewire was placed and a retainer 0-6 suture was placed in a figure-of-eight fashion and allowed to hang loosely.  Subsequently a St. Jude [id number] pacemaker lead was inserted via the sheath and mapped and placed in the right ventricular apex.  However, because of inadequate deployment of the active fixation screw the lead was abandoned. Through a cut in the insulation, a guidewire from the micropuncture kit was placed in  an inverted orientation into the insulation and then reinserted into the subclavian vein.  The retained lead was then separated from the newly inserted guidewire and was removed.  A 5 French sheath was placed over the retained micropuncture guidewire. The dilator was removed and an 038 guidewire was placed and retained and thereafter a 7 [place] hemostatic valve sheath was placed.  Through this sleeve was passed a Medtronic capture fix 5076, 58 cm length ventricular lead, Serial No. [id number].  The bipolar R wave was 9 mV with a pacing impedance of 650 ohms and a pacing threshold of 0.9 volts at 0.5 msec. The current of threshold was 1.3 mA and there was no diaphragmatic pacing at 10 volts.   With these acceptable parameters recorded, the system was implanted.  It was noted that a great deal of the lead was "swallowed" up by the right atrium and right ventricle.  The leads were then attached to a pacesetter Integrity SR pulse generator, Model [id number], Serial no. [id number].  The pocket was copiously irrigated with antibiotic containing saline solution.  The leads in the pulse generator were then inserted into the pocket.  The leads having been previously fixed to the prepectoral fascia and the hemostatic suture having been secured.  The pocket had also previously been copiously irrigated with antibiotic containing saline solution.  The leads in the pulse generator were then secured to the prepectoral fascia.  The wound was closed in 3 layers in a normal fashion.  The wound was washed, dried, and a benzoin Steri-Strips dressing was then applied.  Sponge, needle and instrument counts were correct at  the end of the procedure, according to the staff.  COMPLICATIONS:  None apparent.  The patient was then prepared for AV junctional ablation.  Procedure accompanying operative report.   was manipulated to the right ventricular apex w DD:  01/03/01 TD:  01/03/01 Job: [id number]

## 2011-05-01 NOTE — Discharge Summary (Signed)
[place]. [facility]  Patient:    [name], [name]                        MRN: [id number] Adm. Date:  [id number] Disc. Date: 01/05/01 Attending:  Nathen May Dictator:   Chinita Pester, CRNP CC:         Silvestre Mesi, M.D. at [facility], M.D. [facility] in [facility]   Discharge Summary  PRIMARY DIAGNOSIS:  Atrial fibrillation with tachy-brady syndrome with uncontrolled ventricular rates.  SECONDARY DIAGNOSES: 1. Hypercholesterolemia. 2. Hypertension. 3. Positive tobacco abuse.  HISTORY OF PRESENT ILLNESS:  This is a 76 year old gentleman who is being admitted for an AV ablation and permanent pacemaker.  He has a past medical history of atrial fibrillation with tachy-brady syndrome, as well as cardiomyopathy with an ejection fraction of 35%.  HOSPITAL COURSE:  The patient was admitted and underwent insertion of a pacesetter Integrity SR pulse generator, model 5142, and prepared for AV ablation.  The patient underwent a successful AV ablation without intrinsic rhythm.  He is pacer dependent.  The patient tolerated his postoperative course well, and was discharged to home on the following medications:  DISCHARGE MEDICATIONS: 1. Wellbutrin 150 mg b.i.d. 2. Lipitor 10 mg q.p.m. 3. Cardura 4 mg daily. 4. Zantac 150 mg daily. 5. Coated aspirin daily. 6. Coumadin 5 mg nightly.  ACTIVITY:  He was instructed not to have any strenuous activity or heavy lifting about 5 pounds.  DIET:  Low fat, low cholesterol.  WOUND CARE:  He was instructed to keep his wound clean and dry, no shower for one week.  He may shower on Monday, January 10, 2001.  He was not to raise his left arm above his head for one week, and to move gradually as instructed.  FOLLOWUP:  He was to have a PT/INR on Friday, January 07, 2001.  He was to follow up with Dr. Doristine Mango in two weeks. DD:  01/05/01 TD:  01/05/01 Job: [id number]

## 2011-05-08 ENCOUNTER — Other Ambulatory Visit: Payer: Self-pay | Admitting: *Deleted

## 2011-05-08 DIAGNOSIS — K219 Gastro-esophageal reflux disease without esophagitis: Secondary | ICD-10-CM

## 2011-05-08 MED ORDER — RANITIDINE HCL 150 MG PO CAPS: 150.0000 mg | ORAL_CAPSULE | Freq: Two times a day (BID) | ORAL | Status: AC

## 2011-05-08 NOTE — Telephone Encounter (Signed)
Mail order does not have ranitidine would like sent to local pharmacy

## 2011-05-15 ENCOUNTER — Ambulatory Visit (INDEPENDENT_AMBULATORY_CARE_PROVIDER_SITE_OTHER): Payer: Medicare Other | Admitting: *Deleted

## 2011-05-15 DIAGNOSIS — I4891 Unspecified atrial fibrillation: Secondary | ICD-10-CM

## 2011-05-18 ENCOUNTER — Encounter: Payer: Self-pay | Admitting: Family Medicine

## 2011-05-19 ENCOUNTER — Encounter: Payer: Self-pay | Admitting: Family Medicine

## 2011-05-19 ENCOUNTER — Ambulatory Visit (INDEPENDENT_AMBULATORY_CARE_PROVIDER_SITE_OTHER): Payer: Medicare Other | Admitting: Family Medicine

## 2011-05-19 ENCOUNTER — Ambulatory Visit
Admission: RE | Admit: 2011-05-19 | Discharge: 2011-05-19 | Disposition: A | Discharge status: Home / Self Care | Payer: Medicare Other | Source: Ambulatory Visit | Attending: Family Medicine | Admitting: Family Medicine

## 2011-05-19 VITALS — BP 110/80 | Temp 98.2°F | Wt 167.0 lb

## 2011-05-19 DIAGNOSIS — R109 Unspecified abdominal pain: Secondary | ICD-10-CM

## 2011-05-19 LAB — POCT URINALYSIS DIPSTICK: pH, UA: 7.5

## 2011-05-19 LAB — HEPATIC FUNCTION PANEL
AST: 31 U/L (ref 0–37)
Albumin: 4 g/dL (ref 3.5–5.2)
Alkaline Phosphatase: 105 U/L (ref 39–117)
Total Bilirubin: 0.6 mg/dL (ref 0.3–1.2)

## 2011-05-19 LAB — CBC WITH DIFFERENTIAL/PLATELET
Eosinophils Relative: 1.6 % (ref 0.0–5.0)
HCT: 42.2 % (ref 39.0–52.0)
Hemoglobin: 14 g/dL (ref 13.0–17.0)
Lymphs Abs: 3.1 10*3/uL (ref 0.7–4.0)
Monocytes Relative: 11 % (ref 3.0–12.0)
Platelets: 204 10*3/uL (ref 150.0–400.0)
RBC: 4.24 Mil/uL (ref 4.22–5.81)
WBC: 10.2 10*3/uL (ref 4.5–10.5)

## 2011-05-19 LAB — AMYLASE: Amylase: 80 U/L (ref 27–131)

## 2011-05-19 LAB — LIPASE: Lipase: 117 U/L — ABNORMAL HIGH (ref 11.0–59.0)

## 2011-05-19 NOTE — Patient Instructions (Signed)
We will get your lab work now, then go to the main office for an x-ray of your abdomen.  I will call you the reports this afternoon.  We will also get his set up to see one of the gastroenterologists for further diagnostic studies.  In the meantime, if the discomfort gets worse.  It develop fever, vomiting, etc. Come immediately to the emergency room

## 2011-05-19 NOTE — Progress Notes (Signed)
  Subjective:    Patient ID: [name], male    DOB: 1935/11/03, 76 y.o.   MRN: [id number]  HPI  [name] is a 76 year old, married male, smoker, who comes in today with a 3 to 4 week history of abdominal pain.  He states about 3 to 4 weeks ago.  He does not recall exactly when he developed diffuse abdominal pain and constipation.  He tried over-the-counter Metamucil laxatives.  Nothing seems to work.  His abdomen seems bloated.  He has some anorexia.  No fever or vomiting.  No diarrhea, urinary habits, normal.  He does not recall when his last colonoscopy was the review of systems GI-wise otherwise, negative    Review of Systems    General and GI review of systems otherwise negative Objective:   Physical Exam    Well-developed well-nourished, male in no acute distress.  Examination the abdomen.  It appears to be soft not bloated.  The bowel sounds are normal.  There is diffuse tenderness.  No palpable masses.  Rectum normal prostate going normal.  No school in the rectal vault.  No evidence of constipation or fecal impaction.    Assessment & Plan:  Abdominal pain, unknown etiology.  Plan GI workup

## 2011-05-20 ENCOUNTER — Encounter: Payer: Self-pay | Admitting: Gastroenterology

## 2011-05-20 ENCOUNTER — Telehealth: Payer: Self-pay | Admitting: Family Medicine

## 2011-05-20 NOTE — Telephone Encounter (Signed)
Pt called req to get xray and lab results.

## 2011-05-20 NOTE — Telephone Encounter (Signed)
Patient is aware 

## 2011-05-20 NOTE — Telephone Encounter (Signed)
Pt is feeling better, and wants to know if he can go off the liquid diet?

## 2011-05-21 ENCOUNTER — Emergency Department (HOSPITAL_COMMUNITY): Payer: Medicare Other

## 2011-05-21 ENCOUNTER — Inpatient Hospital Stay (HOSPITAL_COMMUNITY)
Admission: EM | Admit: 2011-05-21 | Discharge: 2011-05-25 | DRG: 439 | Disposition: A | Discharge status: Home / Self Care | Payer: Medicare Other | Attending: Internal Medicine | Admitting: Internal Medicine

## 2011-05-21 ENCOUNTER — Encounter (HOSPITAL_COMMUNITY): Payer: Self-pay | Admitting: Radiology

## 2011-05-21 DIAGNOSIS — K869 Disease of pancreas, unspecified: Principal | ICD-10-CM | POA: Diagnosis present

## 2011-05-21 DIAGNOSIS — I2510 Atherosclerotic heart disease of native coronary artery without angina pectoris: Secondary | ICD-10-CM | POA: Diagnosis present

## 2011-05-21 DIAGNOSIS — Z950 Presence of cardiac pacemaker: Secondary | ICD-10-CM

## 2011-05-21 DIAGNOSIS — I4891 Unspecified atrial fibrillation: Secondary | ICD-10-CM | POA: Diagnosis present

## 2011-05-21 DIAGNOSIS — N390 Urinary tract infection, site not specified: Secondary | ICD-10-CM | POA: Diagnosis present

## 2011-05-21 DIAGNOSIS — E860 Dehydration: Secondary | ICD-10-CM | POA: Diagnosis present

## 2011-05-21 DIAGNOSIS — K5900 Constipation, unspecified: Secondary | ICD-10-CM | POA: Diagnosis present

## 2011-05-21 DIAGNOSIS — M109 Gout, unspecified: Secondary | ICD-10-CM | POA: Diagnosis present

## 2011-05-21 DIAGNOSIS — K560 Paralytic ileus: Secondary | ICD-10-CM | POA: Diagnosis present

## 2011-05-21 DIAGNOSIS — E785 Hyperlipidemia, unspecified: Secondary | ICD-10-CM | POA: Diagnosis present

## 2011-05-21 DIAGNOSIS — Z7901 Long term (current) use of anticoagulants: Secondary | ICD-10-CM

## 2011-05-21 DIAGNOSIS — I428 Other cardiomyopathies: Secondary | ICD-10-CM | POA: Diagnosis present

## 2011-05-21 DIAGNOSIS — I70209 Unspecified atherosclerosis of native arteries of extremities, unspecified extremity: Secondary | ICD-10-CM | POA: Diagnosis present

## 2011-05-21 DIAGNOSIS — I495 Sick sinus syndrome: Secondary | ICD-10-CM | POA: Diagnosis present

## 2011-05-21 LAB — DIFFERENTIAL
Lymphocytes Relative: 28 % (ref 12–46)
Lymphs Abs: 2.9 10*3/uL (ref 0.7–4.0)
Monocytes Absolute: 1.1 10*3/uL — ABNORMAL HIGH (ref 0.1–1.0)
Monocytes Relative: 11 % (ref 3–12)
Neutro Abs: 6.2 10*3/uL (ref 1.7–7.7)

## 2011-05-21 LAB — LIPASE, BLOOD: Lipase: 136 U/L — ABNORMAL HIGH (ref 11–59)

## 2011-05-21 LAB — CBC
HCT: 40.5 % (ref 39.0–52.0)
Hemoglobin: 14.1 g/dL (ref 13.0–17.0)
MCH: 32.3 pg (ref 26.0–34.0)
MCHC: 34.8 g/dL (ref 30.0–36.0)

## 2011-05-21 LAB — BASIC METABOLIC PANEL
BUN: 9 mg/dL (ref 6–23)
Chloride: 96 mEq/L (ref 96–112)
Glucose, Bld: 107 mg/dL — ABNORMAL HIGH (ref 70–99)
Potassium: 4.2 mEq/L (ref 3.5–5.1)

## 2011-05-21 LAB — HEPATIC FUNCTION PANEL
Alkaline Phosphatase: 114 U/L (ref 39–117)
Bilirubin, Direct: 0.2 mg/dL (ref 0.0–0.3)
Indirect Bilirubin: 0.4 mg/dL (ref 0.3–0.9)
Total Bilirubin: 0.6 mg/dL (ref 0.3–1.2)
Total Protein: 6.6 g/dL (ref 6.0–8.3)

## 2011-05-21 LAB — URINE MICROSCOPIC-ADD ON

## 2011-05-21 LAB — URINALYSIS, ROUTINE W REFLEX MICROSCOPIC
Bilirubin Urine: NEGATIVE
Glucose, UA: NEGATIVE mg/dL
Ketones, ur: NEGATIVE mg/dL
pH: 7 (ref 5.0–8.0)

## 2011-05-21 LAB — PROTIME-INR: Prothrombin Time: 35.3 seconds — ABNORMAL HIGH (ref 11.6–15.2)

## 2011-05-21 MED ORDER — IOHEXOL 300 MG/ML  SOLN
80.0000 mL | Freq: Once | INTRAMUSCULAR | Status: AC | PRN
  Administered 2011-05-21: 80 mL via INTRAVENOUS

## 2011-05-21 NOTE — Telephone Encounter (Signed)
labwork shows evidence of pancreatitis.  He needs to stay on a clear liquid diet and see the gastroenterologist ASAP

## 2011-05-21 NOTE — Telephone Encounter (Signed)
Spoke with patient.

## 2011-05-22 ENCOUNTER — Inpatient Hospital Stay (HOSPITAL_COMMUNITY): Payer: Medicare Other

## 2011-05-22 ENCOUNTER — Other Ambulatory Visit (HOSPITAL_COMMUNITY): Payer: Medicare Other

## 2011-05-22 DIAGNOSIS — R740 Nonspecific elevation of levels of transaminase and lactic acid dehydrogenase [LDH]: Secondary | ICD-10-CM

## 2011-05-22 DIAGNOSIS — R634 Abnormal weight loss: Secondary | ICD-10-CM

## 2011-05-22 DIAGNOSIS — R1084 Generalized abdominal pain: Secondary | ICD-10-CM

## 2011-05-22 DIAGNOSIS — R933 Abnormal findings on diagnostic imaging of other parts of digestive tract: Secondary | ICD-10-CM

## 2011-05-22 LAB — DIFFERENTIAL
Basophils Absolute: 0 10*3/uL (ref 0.0–0.1)
Basophils Relative: 0 % (ref 0–1)
Eosinophils Absolute: 0.1 10*3/uL (ref 0.0–0.7)
Monocytes Relative: 15 % — ABNORMAL HIGH (ref 3–12)
Neutrophils Relative %: 66 % (ref 43–77)

## 2011-05-22 LAB — TYPE AND SCREEN
ABO/RH(D): A POS
Antibody Screen: NEGATIVE

## 2011-05-22 LAB — COMPREHENSIVE METABOLIC PANEL
ALT: 202 U/L — ABNORMAL HIGH (ref 0–53)
AST: 328 U/L — ABNORMAL HIGH (ref 0–37)
CO2: 26 mEq/L (ref 19–32)
Calcium: 8.9 mg/dL (ref 8.4–10.5)
Sodium: 133 mEq/L — ABNORMAL LOW (ref 135–145)
Total Protein: 6.5 g/dL (ref 6.0–8.3)

## 2011-05-22 LAB — TSH: TSH: 4.046 u[IU]/mL (ref 0.350–4.500)

## 2011-05-22 LAB — CBC
MCH: 31.4 pg (ref 26.0–34.0)
MCHC: 33.6 g/dL (ref 30.0–36.0)
Platelets: 180 10*3/uL (ref 150–400)
RBC: 3.89 MIL/uL — ABNORMAL LOW (ref 4.22–5.81)

## 2011-05-22 LAB — URINE CULTURE

## 2011-05-22 LAB — PHOSPHORUS: Phosphorus: 3.3 mg/dL (ref 2.3–4.6)

## 2011-05-23 LAB — CBC
Hemoglobin: 11.9 g/dL — ABNORMAL LOW (ref 13.0–17.0)
MCH: 31.5 pg (ref 26.0–34.0)
MCV: 93.1 fL (ref 78.0–100.0)
RBC: 3.78 MIL/uL — ABNORMAL LOW (ref 4.22–5.81)

## 2011-05-23 LAB — BASIC METABOLIC PANEL
BUN: 6 mg/dL (ref 6–23)
Chloride: 105 mEq/L (ref 96–112)
GFR calc Af Amer: >60 mL/min (ref >60)
Glucose, Bld: 96 mg/dL (ref 70–99)
Potassium: 3.9 mEq/L (ref 3.5–5.1)

## 2011-05-24 DIAGNOSIS — R933 Abnormal findings on diagnostic imaging of other parts of digestive tract: Secondary | ICD-10-CM

## 2011-05-24 DIAGNOSIS — R1084 Generalized abdominal pain: Secondary | ICD-10-CM

## 2011-05-24 DIAGNOSIS — R634 Abnormal weight loss: Secondary | ICD-10-CM

## 2011-05-24 DIAGNOSIS — R740 Nonspecific elevation of levels of transaminase and lactic acid dehydrogenase [LDH]: Secondary | ICD-10-CM

## 2011-05-24 LAB — HEPATIC FUNCTION PANEL
ALT: 132 U/L — ABNORMAL HIGH (ref 0–53)
AST: 111 U/L — ABNORMAL HIGH (ref 0–37)
Albumin: 3.2 g/dL — ABNORMAL LOW (ref 3.5–5.2)
Bilirubin, Direct: 0.1 mg/dL (ref 0.0–0.3)

## 2011-05-24 LAB — BASIC METABOLIC PANEL
GFR calc Af Amer: >60 mL/min (ref >60)
GFR calc non Af Amer: >60 mL/min (ref >60)
Potassium: 4.5 mEq/L (ref 3.5–5.1)
Sodium: 138 mEq/L (ref 135–145)

## 2011-05-24 LAB — CBC
HCT: 38 % — ABNORMAL LOW (ref 39.0–52.0)
Hemoglobin: 12.8 g/dL — ABNORMAL LOW (ref 13.0–17.0)
MCH: 31.8 pg (ref 26.0–34.0)
MCHC: 33.7 g/dL (ref 30.0–36.0)

## 2011-05-24 LAB — PROTIME-INR: INR: 2.04 — ABNORMAL HIGH (ref 0.00–1.49)

## 2011-05-25 ENCOUNTER — Telehealth: Payer: Self-pay

## 2011-05-25 DIAGNOSIS — R1084 Generalized abdominal pain: Secondary | ICD-10-CM

## 2011-05-25 DIAGNOSIS — R933 Abnormal findings on diagnostic imaging of other parts of digestive tract: Secondary | ICD-10-CM

## 2011-05-25 DIAGNOSIS — K8689 Other specified diseases of pancreas: Secondary | ICD-10-CM

## 2011-05-25 LAB — HEPARIN LEVEL (UNFRACTIONATED): Heparin Unfractionated: 0.34 IU/mL (ref 0.30–0.70)

## 2011-05-25 LAB — CBC
MCH: 32.4 pg (ref 26.0–34.0)
Platelets: 182 10*3/uL (ref 150–400)
RBC: 4.08 MIL/uL — ABNORMAL LOW (ref 4.22–5.81)
WBC: 9.3 10*3/uL (ref 4.0–10.5)

## 2011-05-25 LAB — COMPREHENSIVE METABOLIC PANEL
ALT: 96 U/L — ABNORMAL HIGH (ref 0–53)
Calcium: 9.2 mg/dL (ref 8.4–10.5)
Creatinine, Ser: 0.79 mg/dL (ref 0.4–1.5)
GFR calc Af Amer: >60 mL/min (ref >60)
Glucose, Bld: 136 mg/dL — ABNORMAL HIGH (ref 70–99)
Sodium: 138 mEq/L (ref 135–145)
Total Protein: 6.6 g/dL (ref 6.0–8.3)

## 2011-05-25 LAB — PROTIME-INR: INR: 1.63 — ABNORMAL HIGH (ref 0.00–1.49)

## 2011-05-25 NOTE — Telephone Encounter (Signed)
Brett Canales is giving the pt his instructions.  meds were reviewed.

## 2011-05-26 ENCOUNTER — Ambulatory Visit: Payer: Medicare Other | Admitting: Gastroenterology

## 2011-05-26 ENCOUNTER — Telehealth: Payer: Self-pay | Admitting: Physician Assistant

## 2011-05-27 NOTE — Telephone Encounter (Signed)
The pt's wife said he was taking the pain medication Amy prescribed him in the hospital, Oxycodone.  He was only taking it 2 times daily per the directions but he is in pain and she asked if he could take it more often.  Per Mike Gip PA, she said he can take it every 4-6 hours as needed for pain.  [name] thanked me for calling back. She thinks this will help her husband endure the pain better.

## 2011-05-28 ENCOUNTER — Ambulatory Visit (HOSPITAL_COMMUNITY)
Admission: RE | Admit: 2011-05-28 | Discharge: 2011-05-28 | Disposition: A | Discharge status: Home / Self Care | Payer: Medicare Other | Source: Ambulatory Visit | Attending: Gastroenterology | Admitting: Gastroenterology

## 2011-05-28 ENCOUNTER — Encounter: Payer: Medicare Other | Admitting: Gastroenterology

## 2011-05-28 ENCOUNTER — Other Ambulatory Visit: Payer: Self-pay | Admitting: Gastroenterology

## 2011-05-28 DIAGNOSIS — C259 Malignant neoplasm of pancreas, unspecified: Secondary | ICD-10-CM

## 2011-05-28 DIAGNOSIS — R634 Abnormal weight loss: Secondary | ICD-10-CM | POA: Insufficient documentation

## 2011-05-28 DIAGNOSIS — R109 Unspecified abdominal pain: Secondary | ICD-10-CM | POA: Insufficient documentation

## 2011-05-28 DIAGNOSIS — R933 Abnormal findings on diagnostic imaging of other parts of digestive tract: Secondary | ICD-10-CM

## 2011-05-28 DIAGNOSIS — Z9089 Acquired absence of other organs: Secondary | ICD-10-CM | POA: Insufficient documentation

## 2011-05-28 DIAGNOSIS — C251 Malignant neoplasm of body of pancreas: Secondary | ICD-10-CM | POA: Insufficient documentation

## 2011-06-01 ENCOUNTER — Other Ambulatory Visit: Payer: Self-pay | Admitting: Gastroenterology

## 2011-06-01 ENCOUNTER — Telehealth: Payer: Self-pay | Admitting: Physician Assistant

## 2011-06-01 DIAGNOSIS — K8689 Other specified diseases of pancreas: Secondary | ICD-10-CM

## 2011-06-01 MED ORDER — OXYCODONE HCL 20 MG PO TABS: 20.0000 mg | ORAL_TABLET | Freq: Two times a day (BID) | ORAL | Status: AC

## 2011-06-01 NOTE — Telephone Encounter (Signed)
Mr [name] had called in saying the pharmacy said it was too soon for his prescription and he only has 1 tablet left of the Oxycxodone HCl.  I called the pharmacy and asked what the problem was. I told them this man has cancer and is in need of his pain medication.  She said the he got # 60 tab recently ,when he was discharged from the hospital 05-01-2011.  I didn't write down the date the pharmacist said.  I told her Mike Gip PA verbally said to me to tell the pt on Fri 05-29-11 that if he is having breakthrough pain, to take 1 tab every 4-6 hours if needed.  The pharamcist, Maralyn Sago, said that is all she needed to know to document.  She will fill the prescription Dr Christella Hartigan prescribed today, Alexia Freestone put it up front for the pt to pick up.

## 2011-06-01 NOTE — Telephone Encounter (Signed)
I called the patient and he did pick up the prescription Dr. Christella Hartigan wrote, # 60- of 20 mg the pt was to take 1 tab twice daily.  The pharmacy only had 10 mg tablets.  The pt was worried about getting the right prescription. I told him we would call the pharmacy and change our prescription to the 10 mg tablets.  Per Chales Abrahams CMA, Dr Christella Hartigan CMA, the pt can take 2 -10mg  tablets twice daily.  We changed the # from 60 to 120 with no refills.  I remind him that Mike Gip PA on Friday 05-29-2011 said if he is having breakthrough pain sometimes and Amy said he could take a dose every 4-6 hours if need be.

## 2011-06-03 ENCOUNTER — Telehealth: Payer: Self-pay

## 2011-06-03 ENCOUNTER — Ambulatory Visit (INDEPENDENT_AMBULATORY_CARE_PROVIDER_SITE_OTHER): Payer: Medicare Other | Admitting: Family Medicine

## 2011-06-03 ENCOUNTER — Encounter: Payer: Self-pay | Admitting: Family Medicine

## 2011-06-03 DIAGNOSIS — C259 Malignant neoplasm of pancreas, unspecified: Secondary | ICD-10-CM

## 2011-06-03 HISTORY — DX: Malignant neoplasm of pancreas, unspecified: C25.9

## 2011-06-03 NOTE — Telephone Encounter (Signed)
Appt with Dr Donell Beers for 06/16/11 130 pm  Blanca to call pt

## 2011-06-03 NOTE — Progress Notes (Signed)
  Subjective:    Patient ID: [name], male    DOB: December 03, 1935, 76 y.o.   MRN: [id number]  HPICarl is a 76 year old, married male, smoker, who comes in today accompanied by his son following hospitalization for evaluation of nausea, elevated lipase, abdominal pain, which turned out to be a pancreatic cancer.  He did see Dr. Nida Boatman.  Next Wednesday.  He says he feels well.  His pain is minimal on the OxyContin 20 mg b.i.d.  He is also taking stool softeners.  He restarted his Coumadin on days ago.  He takes 5 mg one day and 2.5.  The other.  INR today 2.4.  He does understand the nature disease.  He does have a living will and healthcare power of attorney, which is currently being updated by his daughter-in-law, who works at a [employer] firm.    Review of Systems    General in GI review of systems otherwise negative Objective:   Physical Exam Well-developed well-nourished, thin male, in no acute distress       Assessment & Plan:  Pancreatic cancer,,,,,,,,,,, follow-up in oncology next Wednesday,,,,,,,,,, will hold all medicines except for the absolute essential which are Coumadin, Zantac, and pain medication

## 2011-06-03 NOTE — Patient Instructions (Signed)
Hold all your medications, except take the Zantac, one tablet twice daily, Coumadin as directed, pain pills as needed, stool softener as needed.

## 2011-06-05 ENCOUNTER — Encounter (INDEPENDENT_AMBULATORY_CARE_PROVIDER_SITE_OTHER): Payer: Self-pay | Admitting: General Surgery

## 2011-06-10 ENCOUNTER — Encounter: Payer: Medicare Other | Admitting: Oncology

## 2011-06-11 ENCOUNTER — Telehealth: Payer: Self-pay | Admitting: *Deleted

## 2011-06-11 NOTE — Telephone Encounter (Signed)
Pt's wife would like her husband to be able to increase the Oxycodone from 20 mg bid  To a higher dose and closer apart?

## 2011-06-12 NOTE — Telephone Encounter (Signed)
Wife is upset that Dr. Tawanna Cooler has not addressed her questions about increasing her husband's meds.

## 2011-06-12 NOTE — H&P (Signed)
[name], MIKLAS NO.:  [id number]  MEDICAL RECORD NO.:  [id number]  LOCATION:  5528                         FACILITY:  [facility]  PHYSICIAN:  Rock Nephew, MD       DATE OF BIRTH:  13-Apr-1935  DATE OF ADMISSION:  05/21/2011 DATE OF DISCHARGE:                             HISTORY & PHYSICAL   PRIMARY CARE PHYSICIAN:  Tinnie Gens A. Tawanna Cooler, MD at [facility].  PRIMARY ELECTROPHYSIOLOGIST:  Duke Salvia, MD, [facility]  The patient is also supposed to follow up with Dr. Christella Hartigan of GI.  CHIEF COMPLAINT:  Left lower quadrant abdominal pain, decreased appetite, weight loss.  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  The patient reports that he has been having decreased appetite and left lower quadrant abdominal pain that is going on for about 3-4 weeks.  The patient reports the pain comes and goes.  It is worse with eating.  He has been eating less as a result and the patient has had about a 16 pound weight loss in the last 3-4 weeks.  The patient also reports some back pain.  He was brought into the emergency department.  He is not having any nausea, vomiting, any diarrhea.  He does get constipated at times.  The patient had a CT scan of the abdomen and pelvis which showed about a 2.6 x 2.3 x 2.9 cm lesion in the pancreatic tail and neck junction.  We were asked to admit this patient for abdominal pain as well as further workup of this pancreatic lesion.  PAST MEDICAL HISTORY:  History of coronary artery disease but I do not see reports of any cardiac catheterizations, history of cardiomyopathy thought to be tachycardia-induced cardiomyopathy that should have resolved by  now, history of gout, history of pacemaker status post AV nodal ablation.  Also on Coumadin for AFib, history of small bowel obstruction, history of gout.  PAST SURGICAL HISTORY:  Cholecystectomy and colon resection done by actually Dr. Dr. Orson Slick.  SOCIAL HISTORY:  Nondrinker.  No drug use.  The patient is a current smoker.  He lives at home with his wife.  He has no trouble in activities of daily living.  ALLERGIES:  No known drug allergies.  HOME MEDICATIONS: 1. Ocuvite 1 tablet p.o. daily. 2. Vitamin E 400 units 1 capsule p.o. daily. 3. Simvastatin 40 mg p.o. daily at bedtime. 4. Ranitidine 150 mg p.o. twice daily. 5. Nitroglycerin 0.4 mg 1 tablet every 5 minutes up to 3 doses as     needed for chest pain. 6. Vicodin 7.5/325 mg 1 tablet every 6 hours as needed. 7. Flexeril 10 mg 1 tablet by mouth 3 times a day. 8. Colchicine 0.6 mg 1 tablet p.o. daily. 9. Allopurinol 300 mg p.o. daily. 10.Coumadin 5 mg, 0.5 to 1 mg tablet, 2 mg Monday, Wednesday, Friday,     and 5 mg Sunday, Tuesday, Thursday, Saturday.  REVIEW  OF SYSTEMS:  The patient denies any headaches, any blurry vision. He denies any chest pain, shortness breath.  He denies any nausea, any vomiting.  He has abdominal pain.  He has some constipation.  No diarrhea.  No burning on urination.  No pain in his legs.  PHYSICAL EXAMINATION:  VITAL SIGNS:  Temperature 98.3, blood pressure 146/87, pulse rate 69, respiratory rate 18, 98% saturation on room air. HEAD, EYES, EARS, NOSE AND THROAT:  Normocephalic, atraumatic.  Pupils equally round, reactive to light. CARDIOVASCULAR:  S1, S2.  Regular rate and rhythm.  No murmurs, no rubs. LUNGS:  Clear to auscultation bilaterally.  No wheezes, no rhonchi. ABDOMEN:  Soft, slight tenderness left lower quadrant.  No guarding, no rebound tenderness.  Bowel sounds are positive. EXTREMITIES:  No lower extremity edema is evident. NEUROLOGIC:  The patient is alert, awake, oriented  x3.  I could not see a 12-lead EKG present.  CT of the abdomen and pelvis shows new since prior examination slightly complex 2.6 x 2.3 x 2.9 cm lesion within the pancreatic tail and neck junction.  Tumor needs to be excluded.  All this may represent a result of inflammation, infection. Scattered colonic diverticula, most notable descending colon without extraluminal inflammation.  Fluid and gastral prominence size proximal to mid distal small bowel loops without point of obstruction identified and may represent presence of mild ileus attention to this on followup.  LABORATORY STUDIES:  WBC count is 10.4, hemoglobin is 14.1, hematocrit 40.5, MCV is 92.9, platelets were 223,000, neutrophils are 59, INR 3.67. Sodium is 134, potassium 4.2, chloride 96, bicarbonate 28, BUN 9, creatinine 0.94, glucose 107, total bili 0.6, alk phos 114, AST 28, ALT 21, total protein 6.1, albumin 3.4, lipase is 136.  There is also moderate blood, large leukocytes, rare squamous, few bacteria per high- power field.  IMPRESSION AND PLAN:  This is a 76 year old male with history of multiple medical problems admitted for abdominal pain: 1. Abdominal pain could be related to this pancreatic mass that the     patient is having.  The pancreatic mass needs to be investigated.     We will write an order to consult Interventional Radiology for a CT-     guided biopsy.  I will also type and cross match 2 units of FFPs     just in case if they want to give FFP before the procedure.  I will     also get a tumor marker CA19-9.  Also of note the patient reported     that his mother died of some kind of abdominal cancer. 2. Ileus.  The patient will be on clear liquid diet.  We will follow     up serial x-rays. 3. History of coronary artery disease.  We will monitor.  It does not     appear that the patient takes any aspirin or any beta-blockers at     home.  He does take a statin. 4. Hyperlipidemia.  We will continue the  patient on statin. 5. History of atrial fibrillation status post AV nodal ablation with a     pacemaker.  We will monitor.  Currently the patient's Coumadin will     be withheld. 6. History of cardiomyopathy.  Assuming that the patient had     tachycardia-induced cardiomyopathy that should resolve by now. 7. Tobacco abuse.  The patient will be counseled. 8. Possible urinary tract infection.  The patient will be placed on     ceftriaxone  1 gram IV q.24. 9. DVT prophylaxis.  The patient will receive SCDs for deep venous     thrombosis prophylaxis.     Rock Nephew, MD     NH/MEDQ  D:  05/21/2011  T:  05/21/2011  Job:  [id number]  Electronically Signed by Rock Nephew MD on 06/12/2011 11:30:19 PM

## 2011-06-12 NOTE — Telephone Encounter (Signed)
Wife is calling again, and wants to speak to Dr. Tawanna Cooler or his nurse.

## 2011-06-12 NOTE — Telephone Encounter (Signed)
And is okay to increase dose to every 6 hours as needed until  evaluated by Dr. Tawanna Cooler on Monday

## 2011-06-12 NOTE — Telephone Encounter (Signed)
Notified wife per Dr. Charm Rings recommendations.

## 2011-06-15 ENCOUNTER — Encounter (HOSPITAL_BASED_OUTPATIENT_CLINIC_OR_DEPARTMENT_OTHER): Payer: Medicare Other | Admitting: Oncology

## 2011-06-15 ENCOUNTER — Encounter: Payer: Medicare Other | Admitting: *Deleted

## 2011-06-15 ENCOUNTER — Other Ambulatory Visit: Payer: Self-pay | Admitting: Oncology

## 2011-06-15 DIAGNOSIS — C259 Malignant neoplasm of pancreas, unspecified: Secondary | ICD-10-CM

## 2011-06-15 DIAGNOSIS — C251 Malignant neoplasm of body of pancreas: Secondary | ICD-10-CM

## 2011-06-15 DIAGNOSIS — G893 Neoplasm related pain (acute) (chronic): Secondary | ICD-10-CM

## 2011-06-16 ENCOUNTER — Encounter (INDEPENDENT_AMBULATORY_CARE_PROVIDER_SITE_OTHER): Payer: Self-pay | Admitting: General Surgery

## 2011-06-16 ENCOUNTER — Ambulatory Visit (INDEPENDENT_AMBULATORY_CARE_PROVIDER_SITE_OTHER): Payer: Medicare Other | Admitting: General Surgery

## 2011-06-16 DIAGNOSIS — C259 Malignant neoplasm of pancreas, unspecified: Secondary | ICD-10-CM

## 2011-06-16 DIAGNOSIS — E46 Unspecified protein-calorie malnutrition: Secondary | ICD-10-CM

## 2011-06-16 NOTE — Progress Notes (Signed)
Subjective:     Patient ID: [name], male   DOB: Jun 13, 1935, 76 y.o.   MRN: [id number]    BP 122/72  Pulse 62  Temp(Src) 97 F (36.1 C) (Temporal)  Ht 5\' 8"  (1.727 m)  Wt 154 lb 12.8 oz (70.217 kg)  BMI 23.54 kg/m2  HPI: The patient developed acute abdominal pain around 6 weeks ago. He presented to the emergency department a nice CAT scan demonstrating a pancreatic tail mass. He was admitted to the hospital for a brief workup . His CA 19-9 was found to be quite elevated. He was placed on pain medication. He underwent endoscopic ultrasound which demonstrated a heterogeneous mass in the period/tail of the pancreas measuring 2.5 cm. The mass is irregular and encases the splenic artery but does not involve the SMA, SMV, celiac trunk, portal vein. 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 He has had a 30 pound weight loss. He has no appetite. He is referred to me for surgical oncology consultation.   Abdominal Pain This is a new problem. The current episode started more than 1 month ago. The onset quality is gradual. The problem occurs constantly. The most recent episode lasted 6 weeks. The problem has been gradually worsening. The pain is located in the LUQ, LLQ and suprapubic region. The pain is at a severity of 5/10. The pain is severe. The quality of the pain is sharp. The abdominal pain radiates to the left flank. Associated symptoms include anorexia, constipation, nausea (occasional) and weight loss. Pertinent negatives include no belching, diarrhea, dysuria, fever, flatus, frequency, headaches, hematochezia, hematuria, melena, myalgias or vomiting. The pain is aggravated by certain positions and eating. The pain is relieved by nothing. He has tried oral narcotic analgesics for the symptoms. The treatment provided moderate relief. Prior diagnostic workup includes CT scan and upper endoscopy. His past medical history is significant for abdominal surgery.     Review of Systems    Constitutional: Positive for weight loss, appetite change and unexpected weight change. Negative for fever.  HENT: Negative.   Eyes: Negative.   Respiratory: Negative.   Cardiovascular: Negative.   Gastrointestinal: Positive for nausea (occasional), abdominal pain, constipation and anorexia. Negative for vomiting, diarrhea, melena, hematochezia and flatus.  Genitourinary: Negative.  Negative for dysuria, frequency and hematuria.  Musculoskeletal: Positive for back pain. Negative for myalgias.  Skin: Negative.   Neurological: Negative.  Negative for headaches.  Hematological: Negative.   Psychiatric/Behavioral: Negative.        Objective:   Physical Exam  Constitutional: He is oriented to person, place, and time. He appears well-developed and well-nourished. No distress.  HENT:  Head: Normocephalic and atraumatic.  Mouth/Throat: No oropharyngeal exudate.  Eyes: Conjunctivae are normal. Pupils are equal, round, and reactive to light. No scleral icterus.  Neck: Normal range of motion. Neck supple. No thyromegaly present.  Cardiovascular: Normal rate, regular rhythm and normal heart sounds.  Exam reveals no gallop and no friction rub.   No murmur heard. Pulmonary/Chest: Breath sounds normal. No respiratory distress. He has no wheezes. He has no rales. He exhibits no tenderness.  Abdominal: Soft. He exhibits mass (fullness LUQ). He exhibits no distension. There is tenderness. There is no rebound and no guarding.  Musculoskeletal: Normal range of motion. He exhibits no edema and no tenderness.       Scarring L knee  Lymphadenopathy:    He has no cervical adenopathy.  Neurological: He is alert and oriented to person, place, and time. Coordination normal.  Skin: Skin is warm and dry. No rash noted. He is not diaphoretic. No erythema. No pallor.  Psychiatric: He has a normal mood and affect. His behavior is normal. Judgment and thought content normal.       Assessment:        Plan:

## 2011-06-16 NOTE — Assessment & Plan Note (Signed)
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 He also has nodal disease at the celiac axis on CT scan.   Even if the PET is negative for distant disease, I recommend neoadjuvant treatment because I think this is likely to be involving the celiac axis in addition to the splenic artery.  If he has a good response, we can proceed with surgery.

## 2011-06-16 NOTE — Assessment & Plan Note (Signed)
He is seeing dietician at cancer center to assess cachexia

## 2011-06-18 ENCOUNTER — Ambulatory Visit (INDEPENDENT_AMBULATORY_CARE_PROVIDER_SITE_OTHER): Payer: Medicare Other | Admitting: *Deleted

## 2011-06-18 DIAGNOSIS — I4891 Unspecified atrial fibrillation: Secondary | ICD-10-CM

## 2011-06-18 DIAGNOSIS — I442 Atrioventricular block, complete: Secondary | ICD-10-CM

## 2011-06-18 LAB — PACEMAKER DEVICE OBSERVATION
BMOD-0002RV: 12
BRDY-0004RV: 120 {beats}/min
DEVICE MODEL PM: 274654
RV LEAD AMPLITUDE: 10.69 mv

## 2011-06-18 NOTE — Progress Notes (Signed)
Pacer check

## 2011-06-19 ENCOUNTER — Encounter (HOSPITAL_COMMUNITY): Payer: Self-pay

## 2011-06-19 ENCOUNTER — Encounter (HOSPITAL_COMMUNITY)
Admission: RE | Admit: 2011-06-19 | Discharge: 2011-06-19 | Disposition: A | Discharge status: Home / Self Care | Payer: Medicare Other | Source: Ambulatory Visit | Attending: Oncology | Admitting: Oncology

## 2011-06-19 DIAGNOSIS — C259 Malignant neoplasm of pancreas, unspecified: Secondary | ICD-10-CM | POA: Insufficient documentation

## 2011-06-19 LAB — GLUCOSE, CAPILLARY: Glucose-Capillary: 137 mg/dL — ABNORMAL HIGH (ref 70–99)

## 2011-06-19 MED ORDER — FLUDEOXYGLUCOSE F - 18 (FDG) INJECTION
19.1000 | Freq: Once | INTRAVENOUS | Status: AC | PRN
  Administered 2011-06-19: 19.1 via INTRAVENOUS

## 2011-06-24 ENCOUNTER — Ambulatory Visit
Admission: RE | Admit: 2011-06-24 | Discharge: 2011-06-24 | Disposition: A | Discharge status: Home / Self Care | Payer: Medicare Other | Source: Ambulatory Visit | Attending: Radiation Oncology | Admitting: Radiation Oncology

## 2011-06-24 DIAGNOSIS — Z79899 Other long term (current) drug therapy: Secondary | ICD-10-CM | POA: Insufficient documentation

## 2011-06-24 DIAGNOSIS — C259 Malignant neoplasm of pancreas, unspecified: Secondary | ICD-10-CM | POA: Insufficient documentation

## 2011-06-24 DIAGNOSIS — Z8673 Personal history of transient ischemic attack (TIA), and cerebral infarction without residual deficits: Secondary | ICD-10-CM | POA: Insufficient documentation

## 2011-06-24 DIAGNOSIS — I2510 Atherosclerotic heart disease of native coronary artery without angina pectoris: Secondary | ICD-10-CM | POA: Insufficient documentation

## 2011-06-24 DIAGNOSIS — Z950 Presence of cardiac pacemaker: Secondary | ICD-10-CM | POA: Insufficient documentation

## 2011-06-24 DIAGNOSIS — Z510 Encounter for antineoplastic radiation therapy: Secondary | ICD-10-CM | POA: Insufficient documentation

## 2011-06-24 DIAGNOSIS — Z7901 Long term (current) use of anticoagulants: Secondary | ICD-10-CM | POA: Insufficient documentation

## 2011-06-24 DIAGNOSIS — E785 Hyperlipidemia, unspecified: Secondary | ICD-10-CM | POA: Insufficient documentation

## 2011-06-24 DIAGNOSIS — I4891 Unspecified atrial fibrillation: Secondary | ICD-10-CM | POA: Insufficient documentation

## 2011-06-29 ENCOUNTER — Encounter (HOSPITAL_BASED_OUTPATIENT_CLINIC_OR_DEPARTMENT_OTHER): Payer: Medicare Other | Admitting: Oncology

## 2011-06-29 DIAGNOSIS — G893 Neoplasm related pain (acute) (chronic): Secondary | ICD-10-CM

## 2011-06-29 DIAGNOSIS — C259 Malignant neoplasm of pancreas, unspecified: Secondary | ICD-10-CM

## 2011-07-01 NOTE — Discharge Summary (Signed)
NAME:  LYON, DUMONT NO.:  [id number]  MEDICAL RECORD NO.:  [id number]  LOCATION:  5528                         FACILITY:  [facility]  PHYSICIAN:  Thad Ranger, MD       DATE OF BIRTH:  1935-11-07  DATE OF ADMISSION:  05/21/2011 DATE OF DISCHARGE:  05/25/2011                        DISCHARGE SUMMARY - REFERRING   PRIMARY CARE PHYSICIAN:  Tinnie Gens A. Tawanna Cooler, MD.  GASTROENTEROLOGY:  Rachael Fee, MD  DISCHARGE DIAGNOSES: 1. Pancreatic neoplasm with subsequent abdominal pain. 2. Ileus/constipation. 3. History of atrial fibrillation, on chronic Coumadin. 4. Urinary tract infection. 5. History of coronary artery disease.  PROCEDURES DURING THE HOSPITALIZATION: 1. CT of the abdomen and pelvis performed on May 21, 2011 showing new     2.6 x 2.3 x 2.9 cm lesion within the pancreatic tail/neck junction     also consistent with mild ileus and scattered diverticula. 2. Acute abdominal series performed on May 22, 2011 consistent with     ileus.  CONSULTATIONS DURING THE HOSPITALIZATION:  [facility], Dr. Rob Bunting.  BRIEF HISTORY OF PRESENT ILLNESS:  Mr. Ether Griffins is a very pleasant 76 year old male with past medical history of coronary artery disease, history of atrial fibrillation on chronic Coumadin, status post AV node ablation, and pacemaker insertion.  The patient presented to [facility] Emergency Department on day of admission with reports of decreased appetite and left lower quadrant pain for approximately 3-4 weeks.  Of note, the patient had been seen by his primary care physician a couple of days prior to this hospitalization, at which time, he was referred to GI; however, he states the pain became increasingly worse, prompting ER visit.  The patient described pain as intermittent, worse with eating. As a result of pain, the patient has lost approximately 16 pounds in the past 3-4 weeks.  Unfortunately, CT scan of the abdomen and  pelvis performed during admission evaluation showed a lesion of the pancreatic tail.  Lab work has shown a CA19-9 of 16 and 37.5, findings are quite concerning for pancreatic cancer.  COURSE OF HOSPITALIZATION: 1. Abdominal pain in the setting of pancreatic lesion.  Again, [place]     [facility] was asked to see the patient in consultation given     CT findings.  The patient was unable to undergo MRI as he has had     previous pacemaker insertion.  Plan at this point is for the     patient to follow up with Dr. Christella Hartigan on Thursday, June 14 at [facility] for ultrasound-guided biopsy of lesion and to     determine further course of treatment.  In regards to the patient's     chronic Coumadin therapy, he has been maintained on heparin drip     during hospitalization.  His current INR is 1.63.  His Coumadin     will continue to be held for biopsy planned on this Thursday.  The     patient has been scheduled to follow up next week with primary care     physician for recheck of INR and instructions on resuming Coumadin,     which can probably  be done by Gastroenterology prior to this     appointment.  The patient will be maintained on narcotic medication     for abdominal pain at the time of discharge. 2. Ileus/constipation.  The patient does have history of same with     previous hospitalizations.  At this time, the patient's     constipation has greatly improved with strict bowel regimen.  He is     tolerating p.o.'s without any nausea or vomiting.  He has had a     bowel movement just prior to discharge.  We will continue to keep     patient on stool softeners and laxatives with chronic pain     medication. 3. Urinary tract infection.  The patient's urinalysis on admission was     concerning for infection with large leukocytes and 11-20 wbc's.     The patient was treated with 5-day course of IV Rocephin.  Urine     culture obtained at the time of admission is  negative. 4. History of atrial fibrillation, on chronic Coumadin.  The patient     has remained in normal sinus rhythm throughout hospitalization.     Again, Coumadin will need to be held for planned biopsy on 06/14     with resumption of medication to be determined by GI and primary     care physician at followup.  DISCHARGE MEDICATIONS: 1. Allopurinol 300 mg p.o. daily. 2. Colchicine 0.6 mg p.o. daily. 3. Oxycodone 10 mg p.o. b.i.d. (new medication). 4. MiraLax 17 g p.o. daily (new medication). 5. Senokot tablet, 2 tablets p.o. b.i.d. (new medication). 6. Flexeril 10 mg p.o. t.i.d. p.r.n. 7. Hydrocodone/APAP 7.5/325 one tablet p.o. q.6 h. p.r.n. breakthrough     pain. 8. Sublingual nitroglycerin 0.4 mg sublingual q.5 minutes x3 p.r.n.     chest pain. 9. Ocuvite 1 tablet p.o. daily. 10.Ranitidine 150 mg p.o. b.i.d. 11.Simvastatin 40 mg p.o. at bedtime. 12.Vitamin E 400 units p.o. daily. 13.The patient is instructed to hold Coumadin until further     instruction.  PERTINENT LABORATORY DATA:  At the time of discharge; white cell count 9.3, platelet count 182, hemoglobin 13.2, hematocrit 37.9.  Sodium 138, potassium 4.5, BUN 6, creatinine 0.79, total bilirubin 0.4, alkaline phosphatase 167, AST 63, ALT 96, albumin is 3.3.  INR is 1.63.  CA19-9 of 1600 and 37.5.  DISPOSITION:  Again, the patient is felt medically stable for discharge at home at this time.  The patient is to follow up on Thursday, June 14 at 11:00 a.m. at [facility] for scheduled EUS.  In addition, patient has been scheduled for followup with his primary care physician on June 20 at 8:15 a.m., at which time, the patient will need recheck of INR and to restart Coumadin if not already done so by Gastroenterology.     Cordelia Pen, NP   ______________________________ Thad Ranger, MD    LE/MEDQ  D:  05/25/2011  T:  05/25/2011  Job:  [id number]  cc:   Tinnie Gens A. Tawanna Cooler, MD Rachael Fee,  MD  Electronically Signed by Cordelia Pen NP on 06/19/2011 01:44:14 PM Electronically Signed by Andres Labrum Blandon Offerdahl  on 07/01/2011 06:07:41 PM

## 2011-07-02 ENCOUNTER — Ambulatory Visit (INDEPENDENT_AMBULATORY_CARE_PROVIDER_SITE_OTHER): Payer: Medicare Other | Admitting: *Deleted

## 2011-07-02 ENCOUNTER — Encounter: Payer: Medicare Other | Admitting: *Deleted

## 2011-07-02 DIAGNOSIS — I4891 Unspecified atrial fibrillation: Secondary | ICD-10-CM

## 2011-07-06 ENCOUNTER — Other Ambulatory Visit: Payer: Self-pay | Admitting: Oncology

## 2011-07-06 ENCOUNTER — Encounter (HOSPITAL_BASED_OUTPATIENT_CLINIC_OR_DEPARTMENT_OTHER): Payer: Medicare Other | Admitting: Oncology

## 2011-07-06 DIAGNOSIS — C259 Malignant neoplasm of pancreas, unspecified: Secondary | ICD-10-CM

## 2011-07-06 LAB — PROTIME-INR: INR: 1.5 — ABNORMAL LOW (ref 2.00–3.50)

## 2011-07-13 ENCOUNTER — Other Ambulatory Visit: Payer: Self-pay | Admitting: Oncology

## 2011-07-13 ENCOUNTER — Encounter (HOSPITAL_BASED_OUTPATIENT_CLINIC_OR_DEPARTMENT_OTHER): Payer: Medicare Other | Admitting: Oncology

## 2011-07-13 DIAGNOSIS — C251 Malignant neoplasm of body of pancreas: Secondary | ICD-10-CM

## 2011-07-13 DIAGNOSIS — C259 Malignant neoplasm of pancreas, unspecified: Secondary | ICD-10-CM

## 2011-07-13 LAB — PROTIME-INR
INR: 3.4 (ref 2.00–3.50)
Protime: 40.8 Seconds — ABNORMAL HIGH (ref 10.6–13.4)

## 2011-07-16 ENCOUNTER — Other Ambulatory Visit: Payer: Self-pay | Admitting: Oncology

## 2011-07-16 ENCOUNTER — Encounter (HOSPITAL_BASED_OUTPATIENT_CLINIC_OR_DEPARTMENT_OTHER): Payer: Medicare Other | Admitting: Oncology

## 2011-07-16 DIAGNOSIS — C251 Malignant neoplasm of body of pancreas: Secondary | ICD-10-CM

## 2011-07-16 DIAGNOSIS — C259 Malignant neoplasm of pancreas, unspecified: Secondary | ICD-10-CM

## 2011-07-16 LAB — PROTIME-INR: Protime: 28.8 Seconds — ABNORMAL HIGH (ref 10.6–13.4)

## 2011-07-20 ENCOUNTER — Encounter (HOSPITAL_BASED_OUTPATIENT_CLINIC_OR_DEPARTMENT_OTHER): Payer: Medicare Other | Admitting: Oncology

## 2011-07-20 ENCOUNTER — Other Ambulatory Visit: Payer: Self-pay | Admitting: Oncology

## 2011-07-20 DIAGNOSIS — G893 Neoplasm related pain (acute) (chronic): Secondary | ICD-10-CM

## 2011-07-20 DIAGNOSIS — C259 Malignant neoplasm of pancreas, unspecified: Secondary | ICD-10-CM

## 2011-07-20 DIAGNOSIS — C251 Malignant neoplasm of body of pancreas: Secondary | ICD-10-CM

## 2011-07-20 LAB — CBC WITH DIFFERENTIAL/PLATELET
BASO%: 0.4 % (ref 0.0–2.0)
HCT: 38.2 % — ABNORMAL LOW (ref 38.4–49.9)
LYMPH%: 12.7 % — ABNORMAL LOW (ref 14.0–49.0)
MCH: 32.4 pg (ref 27.2–33.4)
MCHC: 34.6 g/dL (ref 32.0–36.0)
MCV: 93.6 fL (ref 79.3–98.0)
MONO%: 13.2 % (ref 0.0–14.0)
NEUT%: 70.8 % (ref 39.0–75.0)
Platelets: 230 10*3/uL (ref 140–400)
RBC: 4.08 10*6/uL — ABNORMAL LOW (ref 4.20–5.82)
nRBC: 0 % (ref 0–0)

## 2011-07-21 ENCOUNTER — Ambulatory Visit: Payer: Self-pay | Admitting: Cardiology

## 2011-07-23 ENCOUNTER — Encounter: Payer: Medicare Other | Admitting: *Deleted

## 2011-07-28 ENCOUNTER — Other Ambulatory Visit: Payer: Self-pay | Admitting: Oncology

## 2011-07-28 ENCOUNTER — Encounter (HOSPITAL_BASED_OUTPATIENT_CLINIC_OR_DEPARTMENT_OTHER): Payer: Medicare Other | Admitting: Oncology

## 2011-07-28 DIAGNOSIS — C251 Malignant neoplasm of body of pancreas: Secondary | ICD-10-CM

## 2011-07-28 DIAGNOSIS — C259 Malignant neoplasm of pancreas, unspecified: Secondary | ICD-10-CM

## 2011-07-28 LAB — PROTIME-INR: Protime: 45.6 Seconds — ABNORMAL HIGH (ref 10.6–13.4)

## 2011-08-04 ENCOUNTER — Encounter (HOSPITAL_BASED_OUTPATIENT_CLINIC_OR_DEPARTMENT_OTHER): Payer: Medicare Other | Admitting: Oncology

## 2011-08-04 ENCOUNTER — Other Ambulatory Visit: Payer: Self-pay | Admitting: Oncology

## 2011-08-04 DIAGNOSIS — C251 Malignant neoplasm of body of pancreas: Secondary | ICD-10-CM

## 2011-08-04 DIAGNOSIS — C259 Malignant neoplasm of pancreas, unspecified: Secondary | ICD-10-CM

## 2011-08-04 DIAGNOSIS — G893 Neoplasm related pain (acute) (chronic): Secondary | ICD-10-CM

## 2011-08-04 LAB — CBC WITH DIFFERENTIAL/PLATELET
BASO%: 0.4 % (ref 0.0–2.0)
Eosinophils Absolute: 0.1 10*3/uL (ref 0.0–0.5)
LYMPH%: 6.8 % — ABNORMAL LOW (ref 14.0–49.0)
MCHC: 34 g/dL (ref 32.0–36.0)
MCV: 99.1 fL — ABNORMAL HIGH (ref 79.3–98.0)
MONO%: 10.2 % (ref 0.0–14.0)
NEUT#: 9 10*3/uL — ABNORMAL HIGH (ref 1.5–6.5)
Platelets: ADEQUATE 10*3/uL (ref 140–400)
RBC: 4.08 10*6/uL — ABNORMAL LOW (ref 4.20–5.82)
RDW: 14.9 % — ABNORMAL HIGH (ref 11.0–14.6)
WBC: 11 10*3/uL — ABNORMAL HIGH (ref 4.0–10.3)

## 2011-08-04 LAB — PROTIME-INR
INR: 1.4 — ABNORMAL LOW (ref 2.00–3.50)
Protime: 16.8 Seconds — ABNORMAL HIGH (ref 10.6–13.4)

## 2011-08-05 ENCOUNTER — Encounter (HOSPITAL_BASED_OUTPATIENT_CLINIC_OR_DEPARTMENT_OTHER): Payer: Medicare Other | Admitting: Oncology

## 2011-08-05 ENCOUNTER — Other Ambulatory Visit: Payer: Self-pay | Admitting: Oncology

## 2011-08-05 DIAGNOSIS — C259 Malignant neoplasm of pancreas, unspecified: Secondary | ICD-10-CM

## 2011-08-05 DIAGNOSIS — C251 Malignant neoplasm of body of pancreas: Secondary | ICD-10-CM

## 2011-08-05 LAB — COMPREHENSIVE METABOLIC PANEL
ALT: 159 U/L — ABNORMAL HIGH (ref 0–53)
AST: 96 U/L — ABNORMAL HIGH (ref 0–37)
Alkaline Phosphatase: 72 U/L (ref 39–117)
Creatinine, Ser: 0.87 mg/dL (ref 0.50–1.35)
Total Bilirubin: 1.2 mg/dL (ref 0.3–1.2)

## 2011-08-14 ENCOUNTER — Emergency Department (HOSPITAL_COMMUNITY): Payer: Medicare Other

## 2011-08-14 ENCOUNTER — Inpatient Hospital Stay (HOSPITAL_COMMUNITY)
Admission: EM | Admit: 2011-08-14 | Discharge: 2011-08-20 | DRG: 388 | Disposition: A | Discharge status: Home Health | Payer: Medicare Other | Attending: Internal Medicine | Admitting: Internal Medicine

## 2011-08-14 DIAGNOSIS — R55 Syncope and collapse: Secondary | ICD-10-CM | POA: Diagnosis present

## 2011-08-14 DIAGNOSIS — Z950 Presence of cardiac pacemaker: Secondary | ICD-10-CM

## 2011-08-14 DIAGNOSIS — I4891 Unspecified atrial fibrillation: Secondary | ICD-10-CM | POA: Diagnosis present

## 2011-08-14 DIAGNOSIS — I369 Nonrheumatic tricuspid valve disorder, unspecified: Secondary | ICD-10-CM

## 2011-08-14 DIAGNOSIS — F17200 Nicotine dependence, unspecified, uncomplicated: Secondary | ICD-10-CM | POA: Diagnosis present

## 2011-08-14 DIAGNOSIS — K56609 Unspecified intestinal obstruction, unspecified as to partial versus complete obstruction: Principal | ICD-10-CM | POA: Diagnosis present

## 2011-08-14 DIAGNOSIS — I2510 Atherosclerotic heart disease of native coronary artery without angina pectoris: Secondary | ICD-10-CM | POA: Diagnosis present

## 2011-08-14 DIAGNOSIS — N170 Acute kidney failure with tubular necrosis: Secondary | ICD-10-CM | POA: Diagnosis present

## 2011-08-14 DIAGNOSIS — C259 Malignant neoplasm of pancreas, unspecified: Secondary | ICD-10-CM | POA: Diagnosis present

## 2011-08-14 DIAGNOSIS — Z9049 Acquired absence of other specified parts of digestive tract: Secondary | ICD-10-CM

## 2011-08-14 DIAGNOSIS — E861 Hypovolemia: Secondary | ICD-10-CM | POA: Diagnosis present

## 2011-08-14 DIAGNOSIS — D61810 Antineoplastic chemotherapy induced pancytopenia: Secondary | ICD-10-CM | POA: Diagnosis not present

## 2011-08-14 DIAGNOSIS — T451X5A Adverse effect of antineoplastic and immunosuppressive drugs, initial encounter: Secondary | ICD-10-CM | POA: Diagnosis not present

## 2011-08-14 DIAGNOSIS — E7800 Pure hypercholesterolemia, unspecified: Secondary | ICD-10-CM | POA: Diagnosis present

## 2011-08-14 DIAGNOSIS — E876 Hypokalemia: Secondary | ICD-10-CM | POA: Diagnosis not present

## 2011-08-14 DIAGNOSIS — I472 Ventricular tachycardia, unspecified: Secondary | ICD-10-CM | POA: Diagnosis not present

## 2011-08-14 DIAGNOSIS — M109 Gout, unspecified: Secondary | ICD-10-CM | POA: Diagnosis present

## 2011-08-14 DIAGNOSIS — I959 Hypotension, unspecified: Secondary | ICD-10-CM | POA: Diagnosis present

## 2011-08-14 DIAGNOSIS — E871 Hypo-osmolality and hyponatremia: Secondary | ICD-10-CM | POA: Diagnosis present

## 2011-08-14 DIAGNOSIS — Z79899 Other long term (current) drug therapy: Secondary | ICD-10-CM

## 2011-08-14 DIAGNOSIS — IMO0002 Reserved for concepts with insufficient information to code with codable children: Secondary | ICD-10-CM

## 2011-08-14 DIAGNOSIS — R748 Abnormal levels of other serum enzymes: Secondary | ICD-10-CM | POA: Diagnosis present

## 2011-08-14 DIAGNOSIS — G893 Neoplasm related pain (acute) (chronic): Secondary | ICD-10-CM | POA: Diagnosis present

## 2011-08-14 DIAGNOSIS — Z923 Personal history of irradiation: Secondary | ICD-10-CM

## 2011-08-14 DIAGNOSIS — I4729 Other ventricular tachycardia: Secondary | ICD-10-CM | POA: Diagnosis not present

## 2011-08-14 LAB — POCT I-STAT TROPONIN I

## 2011-08-14 LAB — CBC
Hemoglobin: 14.9 g/dL (ref 13.0–17.0)
MCH: 33.3 pg (ref 26.0–34.0)
MCV: 96 fL (ref 78.0–100.0)
RBC: 4.47 MIL/uL (ref 4.22–5.81)

## 2011-08-14 LAB — COMPREHENSIVE METABOLIC PANEL
ALT: 249 U/L — ABNORMAL HIGH (ref 0–53)
CO2: 23 mEq/L (ref 19–32)
Calcium: 9.7 mg/dL (ref 8.4–10.5)
GFR calc Af Amer: 53 mL/min — ABNORMAL LOW (ref >60)
GFR calc non Af Amer: 44 mL/min — ABNORMAL LOW (ref >60)
Glucose, Bld: 224 mg/dL — ABNORMAL HIGH (ref 70–99)
Sodium: 130 mEq/L — ABNORMAL LOW (ref 135–145)
Total Bilirubin: 2.1 mg/dL — ABNORMAL HIGH (ref 0.3–1.2)

## 2011-08-14 LAB — URINALYSIS, ROUTINE W REFLEX MICROSCOPIC
Ketones, ur: NEGATIVE mg/dL
Protein, ur: 30 mg/dL — AB
Urobilinogen, UA: 1 mg/dL (ref 0.0–1.0)

## 2011-08-14 LAB — CARDIAC PANEL(CRET KIN+CKTOT+MB+TROPI)
CK, MB: 6.9 ng/mL (ref 0.3–4.0)
CK, MB: 6.4 ng/mL (ref 0.3–4.0)
Relative Index: INVALID (ref 0.0–2.5)
Total CK: 32 U/L (ref 7–232)
Total CK: 36 U/L (ref 7–232)
Troponin I: <0.3 ng/mL (ref <0.30)

## 2011-08-14 LAB — DIFFERENTIAL
Eosinophils Absolute: 0.1 10*3/uL (ref 0.0–0.7)
Lymphocytes Relative: 3 % — ABNORMAL LOW (ref 12–46)
Lymphs Abs: 0.3 10*3/uL — ABNORMAL LOW (ref 0.7–4.0)
Neutro Abs: 9.1 10*3/uL — ABNORMAL HIGH (ref 1.7–7.7)
Neutrophils Relative %: 89 % — ABNORMAL HIGH (ref 43–77)

## 2011-08-14 LAB — MAGNESIUM: Magnesium: 2 mg/dL (ref 1.5–2.5)

## 2011-08-14 LAB — PHOSPHORUS: Phosphorus: 4.5 mg/dL (ref 2.3–4.6)

## 2011-08-14 LAB — URINE MICROSCOPIC-ADD ON

## 2011-08-15 ENCOUNTER — Inpatient Hospital Stay (HOSPITAL_COMMUNITY): Payer: Medicare Other

## 2011-08-15 LAB — CBC
HCT: 33 % — ABNORMAL LOW (ref 39.0–52.0)
MCH: 33.3 pg (ref 26.0–34.0)
MCHC: 34.9 g/dL (ref 30.0–36.0)
MCV: 95.7 fL (ref 78.0–100.0)
MCV: 95.8 fL (ref 78.0–100.0)
Platelets: 108 10*3/uL — ABNORMAL LOW (ref 150–400)
RDW: 17.6 % — ABNORMAL HIGH (ref 11.5–15.5)
RDW: 17.2 % — ABNORMAL HIGH (ref 11.5–15.5)
WBC: 2.1 10*3/uL — ABNORMAL LOW (ref 4.0–10.5)

## 2011-08-15 LAB — COMPREHENSIVE METABOLIC PANEL
AST: 55 U/L — ABNORMAL HIGH (ref 0–37)
Albumin: 2.4 g/dL — ABNORMAL LOW (ref 3.5–5.2)
BUN: 27 mg/dL — ABNORMAL HIGH (ref 6–23)
Chloride: 100 mEq/L (ref 96–112)
Creatinine, Ser: 0.95 mg/dL (ref 0.50–1.35)
Total Bilirubin: 1.8 mg/dL — ABNORMAL HIGH (ref 0.3–1.2)
Total Protein: 4.6 g/dL — ABNORMAL LOW (ref 6.0–8.3)

## 2011-08-16 LAB — COMPREHENSIVE METABOLIC PANEL
AST: 43 U/L — ABNORMAL HIGH (ref 0–37)
BUN: 17 mg/dL (ref 6–23)
CO2: 25 mEq/L (ref 19–32)
Calcium: 7.8 mg/dL — ABNORMAL LOW (ref 8.4–10.5)
Chloride: 101 mEq/L (ref 96–112)
Creatinine, Ser: 0.77 mg/dL (ref 0.50–1.35)
GFR calc Af Amer: >60 mL/min (ref >60)
GFR calc non Af Amer: >60 mL/min (ref >60)
Glucose, Bld: 137 mg/dL — ABNORMAL HIGH (ref 70–99)
Total Bilirubin: 1.1 mg/dL (ref 0.3–1.2)

## 2011-08-16 LAB — CBC
HCT: 31.5 % — ABNORMAL LOW (ref 39.0–52.0)
Hemoglobin: 11.2 g/dL — ABNORMAL LOW (ref 13.0–17.0)
MCH: 33.9 pg (ref 26.0–34.0)
MCV: 95.5 fL (ref 78.0–100.0)
Platelets: 96 10*3/uL — ABNORMAL LOW (ref 150–400)
RBC: 3.3 MIL/uL — ABNORMAL LOW (ref 4.22–5.81)
WBC: 1.9 10*3/uL — ABNORMAL LOW (ref 4.0–10.5)

## 2011-08-16 LAB — MAGNESIUM: Magnesium: 1.8 mg/dL (ref 1.5–2.5)

## 2011-08-17 DIAGNOSIS — C259 Malignant neoplasm of pancreas, unspecified: Secondary | ICD-10-CM

## 2011-08-17 LAB — DIFFERENTIAL
Basophils Relative: 0 % (ref 0–1)
Eosinophils Absolute: 0 10*3/uL (ref 0.0–0.7)
Eosinophils Relative: 2 % (ref 0–5)
Lymphs Abs: 0.1 10*3/uL — ABNORMAL LOW (ref 0.7–4.0)
Monocytes Absolute: 0.2 10*3/uL (ref 0.1–1.0)
Neutro Abs: 1.2 10*3/uL — ABNORMAL LOW (ref 1.7–7.7)

## 2011-08-17 LAB — BASIC METABOLIC PANEL
Calcium: 7.8 mg/dL — ABNORMAL LOW (ref 8.4–10.5)
GFR calc Af Amer: >60 mL/min (ref >60)
GFR calc non Af Amer: >60 mL/min (ref >60)
Glucose, Bld: 120 mg/dL — ABNORMAL HIGH (ref 70–99)
Potassium: 3.4 mEq/L — ABNORMAL LOW (ref 3.5–5.1)
Sodium: 133 mEq/L — ABNORMAL LOW (ref 135–145)

## 2011-08-17 LAB — CBC
HCT: 32.6 % — ABNORMAL LOW (ref 39.0–52.0)
MCH: 33.4 pg (ref 26.0–34.0)
MCHC: 35.3 g/dL (ref 30.0–36.0)
MCV: 94.8 fL (ref 78.0–100.0)
RDW: 17.3 % — ABNORMAL HIGH (ref 11.5–15.5)
WBC: 1.5 10*3/uL — ABNORMAL LOW (ref 4.0–10.5)

## 2011-08-18 ENCOUNTER — Ambulatory Visit: Payer: Medicare Other | Admitting: Cardiovascular Disease

## 2011-08-18 LAB — DIFFERENTIAL
Basophils Absolute: 0 10*3/uL (ref 0.0–0.1)
Lymphs Abs: 0.2 10*3/uL — ABNORMAL LOW (ref 0.7–4.0)
Monocytes Absolute: 0.4 10*3/uL (ref 0.1–1.0)
Monocytes Relative: 24 % — ABNORMAL HIGH (ref 3–12)
Neutro Abs: 1.1 10*3/uL — ABNORMAL LOW (ref 1.7–7.7)

## 2011-08-18 LAB — CBC
MCH: 33.8 pg (ref 26.0–34.0)
MCHC: 35.9 g/dL (ref 30.0–36.0)
MCV: 94.2 fL (ref 78.0–100.0)
Platelets: 121 10*3/uL — ABNORMAL LOW (ref 150–400)
RBC: 3.46 MIL/uL — ABNORMAL LOW (ref 4.22–5.81)

## 2011-08-18 LAB — BASIC METABOLIC PANEL
CO2: 22 mEq/L (ref 19–32)
Calcium: 7.6 mg/dL — ABNORMAL LOW (ref 8.4–10.5)
Creatinine, Ser: 0.69 mg/dL (ref 0.50–1.35)
GFR calc non Af Amer: >60 mL/min (ref >60)
Glucose, Bld: 124 mg/dL — ABNORMAL HIGH (ref 70–99)

## 2011-08-20 LAB — BASIC METABOLIC PANEL
BUN: 12 mg/dL (ref 6–23)
CO2: 23 mEq/L (ref 19–32)
Chloride: 103 mEq/L (ref 96–112)
Creatinine, Ser: 0.67 mg/dL (ref 0.50–1.35)

## 2011-08-20 LAB — DIFFERENTIAL
Basophils Relative: 0 % (ref 0–1)
Eosinophils Relative: 3 % (ref 0–5)
Lymphs Abs: 0.2 10*3/uL — ABNORMAL LOW (ref 0.7–4.0)
Monocytes Relative: 23 % — ABNORMAL HIGH (ref 3–12)
Neutro Abs: 1.9 10*3/uL (ref 1.7–7.7)

## 2011-08-20 LAB — CBC
HCT: 32.7 % — ABNORMAL LOW (ref 39.0–52.0)
Hemoglobin: 11.1 g/dL — ABNORMAL LOW (ref 13.0–17.0)
MCV: 96.2 fL (ref 78.0–100.0)
Platelets: 117 10*3/uL — ABNORMAL LOW (ref 150–400)
RBC: 3.4 MIL/uL — ABNORMAL LOW (ref 4.22–5.81)
WBC: 2.8 10*3/uL — ABNORMAL LOW (ref 4.0–10.5)

## 2011-08-20 LAB — CULTURE, BLOOD (ROUTINE X 2): Culture  Setup Time: 201208311357

## 2011-08-20 NOTE — Consult Note (Signed)
NAMECAYDAN, [name]              ACCOUNT NO.:  [id number]  MEDICAL RECORD NO.:  [id number]  LOCATION:  1241                         FACILITY:  [facility]  PHYSICIAN:  Mary Sella. Andrey Campanile, MD     DATE OF BIRTH:  10-24-1935  DATE OF CONSULTATION:  08/14/2011 DATE OF DISCHARGE:                                CONSULTATION   REQUESTING PHYSICIAN:  Hartley Barefoot, MD  PRIMARY CARE PHYSICIAN:  Eugenio Hoes. Tawanna Cooler, MD  CONSULTING SURGEON:  Mary Sella. Andrey Campanile, MD  ONCOLOGIST:  Leighton Roach. Truett Perna, MD  CARDIOLOGIST:  Duke Salvia, MD, [facility]  REASON FOR CONSULTATION:  Abdominal pain and small bowel obstruction.  HISTORY OF PRESENT ILLNESS:  [name] is a 76 year old white male with a complex past medical history including pancreatic cancer who just underwent his last chemotherapy and radiation treatment yesterday.  The patient was apparently recently diagnosed with pancreatic cancer.  He initially instead of undergoing any type of surgical resection has been completed chemoradiation.  The extent of the patient's pancreatic cancer is unclear currently as the family is unaware just how aggressive the disease is as far as what stage the patient has.  The patient developed left-sided abdominal pain last night after his last episode of chemoradiation.  He did have 1 episode of emesis this morning after the wife gave him a Percocet and Zofran.  She describes this was foamy and white and not bilious in nature.  Apparently, last night the patient had multiple syncopal episodes at home.  The wife tried to push fluids on the patient.  She states that he has not eaten anything for 3 weeks.  He is drinking very minimal.  He has not had a bowel movement since Tuesday, however, all bowel movements prior to this has been mostly diarrhea on an every-other-day basis.  The patient denies any fever or chills.  The wife eventually brought the patient to the emergency department overnight where he was found to be  hypotensive. He had further workup which revealed a CT scan of the abdomen and pelvis.  This revealed findings consistent with a small bowel obstruction with small bowel measuring 3.4 cm.  The distal small bowel is decompressed and there is a transition point in the anterior abdomen just left of midline.  The patient also has pancreatic mass with peripancreatic lymphadenopathy which is better characterized on a recent PET scan.  Because of this finding as well as hypotension, the patient was admitted.  We have been asked to consult for his bowel obstruction.  REVIEW OF SYSTEMS:  Please see HPI.  Otherwise, all others systems has currently been reviewed are negative.  FAMILY HISTORY:  Noncontributory.  PAST MEDICAL HISTORY: 1. Pancreatic cancer. 2. Coronary artery disease. 3. Cardiomyopathy. 4. Emphysema. 5. Gout. 6. Atrial fibrillation. 7. History of small bowel obstruction. 8. History of, I believe, perforated diverticulitis for which the wife     states he had a large bowel resection.  PAST SURGICAL HISTORY:  The patient has had multiple orthopedic surgeries on his leg, his knees, in his back.  In particular to his abdomen, he has had bilateral inguinal hernia repairs as well as I think a sigmoid  colectomy based of the description, however, the wife just states he has had a large bowel resection.  SOCIAL HISTORY:  The patient is married.  He has 3 children.  He denies any alcohol.  He admits to smoking very little.  He smokes approximately 3-4 cigarettes over the last week or two.  ALLERGIES:  NKDA.  MEDICATIONS: 1. Chemotherapy regimen. 2. Nitroglycerin 0.4 mg sublingual p.r.n. 3. Prednisone 10 mg daily. 4. Oxycodone 5 mg 1 every 4-6 hours p.r.n. pain. 5. Zofran 8 mg q.8 h. p.r.n. 6. Zantac 150 mg b.i.d.  PHYSICAL EXAMINATION:  GENERAL:  [name] is an elderly appearing white male who is otherwise lying in bed, in no acute distress. VITAL SIGNS:  Temperature 97.6,  pulse 77, blood pressure 114/74, respirations 16. HEENT:  Head is normocephalic, atraumatic.  Sclerae noninjected.  Pupils are equal, round, and reactive to light.  Ears and nose without any obvious masses or lesions.  No rhinorrhea.  Mouth is pink.  Throat shows no exudate. HEART:  Regular rate and rhythm.  He is paced.  He otherwise does not have any obvious murmurs, gallops, or rubs noted.  He does have palpable carotid, radial, and pedal pulses bilaterally. LUNGS:  Clear to auscultation bilaterally with no wheezes, rhonchi, or rales noted.  Respiratory effort is nonlabored. ABDOMEN:  Soft with some left-sided abdominal tenderness.  No guarding, peritoneal signs, or rebounding are noted.  He does have hypoactive bowel sounds.  Otherwise, is nondistended.  No other masses, hernias, or organomegaly are noted. MUSCULOSKELETAL:  All 4 extremities are symmetrical with no cyanosis, clubbing, or edema.  He does have some obvious deformities of the left lower extremity from prior muscle flap surgeries. SKIN:  Warm and dry with no masses, lesions, or rashes. PSYCHIATRIC:  The patient is sleepy, but otherwise oriented x3 and appropriate affect.  LABORATORY DATA AND DIAGNOSTICS:  White blood cell count is 10,300, hemoglobin 14.9, hematocrit 42.9, platelet count is 150,000.  Sodium 130, potassium 4.5, glucose 224, BUN 29, creatinine 1.55.  AST 114, ALT 249, alkaline phosphatase 98, total bilirubin 2.1.  CT scan of the abdomen and pelvis reveals small bowel dilatation, consistent small bowel obstruction.  He also has a pancreatic mass which is noted with some peripancreatic lymphadenopathy.  IMPRESSION: 1. Small bowel obstruction, question secondary to adhesive disease     versus malignant disease. 2. Syncope with hypotension secondary to volume depletion. 3. Pancreatic cancer. 4. Coronary artery disease. 5. Cardiomyopathy. 6. Atrial fibrillation. 7. Emphysema. 8. Gout. 9. History of  small bowel obstruction.  PLAN:  We agree with the current hospitalist plan.  We will also hold off on NG tube currently as the patient is without any emesis or nausea currently.  If the patient develops nausea or emesis, then I have written an order to place an NG tube.  In the meantime, we would recommend keeping the patient n.p.o. and repeating abdominal films in the morning to follow the progression of his bowel obstruction. Ideally, the patient will get better with conservative management as he has in the past.  However, if he does not, he may need some type of surgical intervention.  I have discussed this with the family.  I have also discussed with the family that the patient has a very, very high risk surgical candidate given his medical problems as well as his recent chemotherapy and radiation treatments and the fact that he is on prednisone.  This puts him at significant risk for surgical complications and  healing complications.  We will follow the patient along with you.     Letha Cape, PA   ______________________________ Mary Sella. Andrey Campanile, MD    KEO/MEDQ  D:  08/14/2011  T:  08/15/2011  Job:  [id number]  cc:   Ladene Artist, M.D. Fax: [phone]  Hartley Barefoot, MD  Eugenio Hoes. Tawanna Cooler, MD [address] [place] [address]  Duke Salvia, MD, [facility] [address]  [address] [place] [address]  Electronically Signed by Barnetta Chapel PA on 08/19/2011 12:10:12 PM Electronically Signed by Gaynelle Adu M.D. on 08/20/2011 02:21:43 PM

## 2011-08-22 NOTE — Discharge Summary (Signed)
[name], [name]              ACCOUNT NO.:  [id number]  MEDICAL RECORD NO.:  [id number]  LOCATION:  1442                         FACILITY:  [facility]  PHYSICIAN:  Brendan Barefoot, MD    DATE OF BIRTH:  11-Apr-1935  DATE OF ADMISSION:  08/14/2011 DATE OF DISCHARGE:  08/19/2011                        DISCHARGE SUMMARY - REFERRING   DISCHARGE MEDICATIONS:  To be determined.  DISCHARGE DIAGNOSES: 1. Syncope secondary to hypotension. 2. Hypotension secondary to dehydration, decreased volume. 3. Partial small bowel obstruction, resolved. 4. Increased CK-MB in the setting of renal failure. 5. Acute renal failure, prerenal acute tubular necrosis component. 6. Hyponatremia secondary to dehydration. 7. Pancreatic cancer with likely metastatic disease. 8. History of atrial fibrillation, off Coumadin.  STUDIES PERFORMED: 1. KUB, September 1st, showed improved to resolve a small bowel     obstruction. 2. Chest x-ray, August 31st, showed COPD.  No active disease. 3. CT abdomen and pelvis, August 31st show pancreatic mass with     peripancreatic lymphadenopathy, better characterized from PET scan.     There are dilated loops of small bowel measuring up to 3.4 cm in     diameter.  The distal small bowel loops are decompressed.  There is     a transition point in the anterior abdomen, left of midline.  Large     diverticulosis.  CONSULTANTS: 1. Dr. Andrey Jacobs with Surgery. 2. Dr. Truett Jacobs, Oncologist.  BRIEF HISTORY OF PRESENT ILLNESS:  This is a very pleasant 76 year old with past medical history significant for AFib, off Coumadin, coronary artery disease, adenocarcinoma of pancreas, who recently finished radiation therapy for 5 weeks.  He also finished chemotherapy.  He presents to the emergency department after syncopal event.  The patient was getting out of bed early in the morning when he went to the bathroom and he started to feel lightheadedness.  He set down in the toilet and he  passed out.  He had another episode in the emergency department when they were trying to orthostatic vitals.  He denies chest pain or shortness of breath.  He was also complaining of abdominal pain that started the day prior to admission, 7/10 in intensity, intermittent.  He had 1 episode of vomiting the night prior to admission.  He has not been able to eat very well.  HOSPITAL COURSE: 1. Syncope in the setting of hypotension and hypovolemia.  The patient     was admitted to the step-down unit.  He was started on IV fluids.     His hypotension resolved.  No more syncopal event.  He has been     working with physical therapy.  They recommend home health PT. 2. Partial small bowel obstruction.  The patient was made n.p.o., IV     fluids.  Surgery was consulted and help with medical management.     The patient did not require an NG tube.  On the second day of     hospitalization, he started to have bowel movement and loose stool.     A repeated KUB show improved to resolve a small bowel obstruction.     He has been now tolerating small amount of soft diet. 3. Acute renal failure  in the setting of decreased volume acute     tubular necrosis component.  His creatinine peaked to 1.5 and the     day prior to discharge, creatinine was 0.69. 4. Hyponatremia secondary to dehydration.  His sodium has improved now     at 133. 5. Hypokalemia.  We will replete potassium.  The patient might require     potassium supplements. 6. Hypotension.  The patient was admitted to the step-down unit due to     hypotension.  He was started on a stress dose steroid,     hydrocortisone 50 mg IV b.i.d., then he was transitioned to his     prednisone 10 mg p.o. daily.  On September 4th, Dr. Truett Jacobs     stopped prednisone because he was saying that prednisone might not     help with appetite. 7. Nonsustained ventricular tachycardia.  The patient had 7 runs of     ventricular tachycardia.  His magnesium was repleted  and his     potassium. 8. Adenocarcinoma of the pancreas with likely metastatic disease.  He     will need to follow up with Dr. Truett Jacobs. 9. The patient might be able to be discharged on September 5th if his     blood pressure is stable, if no significant diarrhea, and if he is     tolerating diet.     Brendan Barefoot, MD     BR/MEDQ  D:  08/18/2011  T:  08/18/2011  Job:  [id number]  Electronically Signed by Brendan Barefoot MD on 08/22/2011 01:15:54 PM

## 2011-08-22 NOTE — H&P (Signed)
[name], [name]              ACCOUNT NO.:  [id number]  MEDICAL RECORD NO.:  [id number]  LOCATION:  1241                         FACILITY:  [facility]  PHYSICIAN:  Hartley Barefoot, MD    DATE OF BIRTH:  06-24-1935  DATE OF ADMISSION:  08/14/2011 DATE OF DISCHARGE:                             HISTORY & PHYSICAL   CHIEF COMPLAINT:  Passing out.  HISTORY OF PRESENT ILLNESS:  This is a very pleasant 76 year old with past medical history significant for atrial defibrillation off Coumadin, coronary artery disease, adenocarcinoma of the pancreas, who recently finished radiation therapy for 5 weeks the day prior to admission.  He also finished with chemotherapy.  He presents to the emergency department after a syncope or near syncope event.  The patient was getting out of bed early in the morning, at 3 in the morning to go to the bathroom, when he started to feel lightheadedness.  He sat down on the toilet and he passed out.  He had another episode in the emergency department when they were doing orthostatic vitals.  The patient denies any chest pain, shortness of breath.  He relates some cough, but nothing bad.  He is also complaining of abdominal pain that had started the day prior to admission, 7/10 in intensity, intermittent.  He had one episode of vomiting the night prior to admission, he vomits some pills.  He has decreased appetite, not eating.  His last bowel movement was 2 or 3 days prior to admission.  He has not passed any gas today.  ALLERGIES:  No known drug allergies.  PAST MEDICAL HISTORY: 1. Adenocarcinoma of the pancreas, status post radiation therapy. 2. History of ileus and constipation. 3. History of atrial fibrillation, off Coumadin. 4. History of coronary artery disease. 5. History of gout. 6. Prior history of sigmoid colectomy. 7. Hypercholesterolemia.  HOME MEDICATIONS: 1. Nitroglycerin 0.4 sublingual every 5 minutes needed up to 3 doses. 2. Prednisone 10  mg p.o. daily. 3. Oxycodone 5 mg every 4 to 6 hours as needed. 4. Zofran 8 mg every 8 hours as needed. 5. Zantac 150 mg b.i.d.  SOCIAL HISTORY:  Nondrinker.  No recreational drugs.  He lives at home with his wife.  PAST SURGICAL HISTORY: 1. Cholecystectomy. 2. Sigmoid colectomy. 3. Pacemaker insertion.  PAST MEDICAL HISTORY:  Right complete heart block status post AV junction ablation.  Status post pacemaker.  FAMILY HISTORY:  Noncontributory.  REVIEW OF SYSTEMS:  Negative except as per HPI.  PHYSICAL EXAMINATION:  VITAL SIGNS:  Blood pressure 120/60, respiration 15, saturation 99 on room air, temperature 98, pulse 76. GENERAL:  The patient lying in bed in no acute distress. HEENT:  Head, atraumatic, normocephalic.  Eyes anicteric.  Pupils equal, reactive to light.  Extraocular muscles intact. CARDIOVASCULAR:  S1 and S2.  Regular rhythm and rate.  No rubs, murmurs or gallops. LUNGS:  Bilateral good air movement, bilateral rhonchi.  No crackles. ABDOMEN:  Bowel sounds absent.  No guarding.  No epigastric tenderness. Epigastric and mid-abdomen, no rigidity. EXTREMITIES:  No edema.  No cyanosis. NEUROLOGIC:  Alert and oriented x3.  Cranial nerves II to XII intact. Sensation grossly intact.  Motor strength 5/5 throughout.  ADMISSION LABORATORY DATA:  White blood cell 10.3, hemoglobin 14.9, hematocrit 42, platelet 150.  Sodium 130, potassium 4.5, chloride 89, bicarb 23, BUN 29, creatinine 1.5, glucose 224, lipase 19, troponin 0.09.  CT abdomen and pelvis showed dilated loop of small bowel measuring up to 3.4 cm, small bowel loops are decompressed.  There is a transition point in the anterior abdomen, left of midline.  Large bowel diverticulosis.  Pancreatic mass with peripancreatic lymphadenopathy.   ASSESSMENT AND PLAN: 1. Syncope, likely secondary to hypotension and hypovolemia.  The     patient without  focal deficit on neuro exam.  Recycle cardiac     enzymes.  We will  continue with IV fluids.  We will monitor on     telemetry. 2. Small-bowel obstruction.  We will make the patient n.p.o. IV     fluids.  I will consult surgery.  The patient without vomiting.  No     significant abdominal distention.  I will defer NG tube to surgery     consideration. 3. Acute renal failure secondary to decreased volume ATN component.     We will start IV fluids.  Repeat renal function in the morning.  If     no improvement, we can consider renal ultrasound. 4. Hyponatremia in the setting of dehydration and renal failure.  We     will continue with gentle hydration. 5. Hypotension, possible to decreased volume.  We will do a workup for     infection, we will check lactic acid to check for sepsis.  We will     give a stress dose of steroid.  We will continue with IV fluid.     Blood pressure has improved, he is now in the 120 range. 6. Mild increased troponin, possibility of mild ischemia, very mild     increase 0.09.  We will continue with cycle of cardiac enzymes and     EKG.  The patient received aspirin already with that EKG.  He     denies chest pain. 7. Adenocarcinoma of the pancreas.  The patient finished radiation and     chemo treatment.  He will need to follow with his oncologist. 8. History of atrial fibrillation.  The patient is now sinus.     Coumadin was stopped as per the patient.  We will monitor on     telemetry. 9. For DVT prophylaxis, SCDs at this time.     Hartley Barefoot, MD     BR/MEDQ  D:  08/14/2011  T:  08/14/2011  Job:  [id number]  Electronically Signed by Hartley Barefoot MD on 08/22/2011 01:14:40 PM

## 2011-08-25 ENCOUNTER — Encounter (HOSPITAL_BASED_OUTPATIENT_CLINIC_OR_DEPARTMENT_OTHER): Payer: Medicare Other | Admitting: Oncology

## 2011-08-25 ENCOUNTER — Other Ambulatory Visit: Payer: Self-pay | Admitting: Oncology

## 2011-08-25 DIAGNOSIS — G893 Neoplasm related pain (acute) (chronic): Secondary | ICD-10-CM

## 2011-08-25 DIAGNOSIS — C259 Malignant neoplasm of pancreas, unspecified: Secondary | ICD-10-CM

## 2011-08-25 DIAGNOSIS — C251 Malignant neoplasm of body of pancreas: Secondary | ICD-10-CM

## 2011-08-25 LAB — CBC WITH DIFFERENTIAL/PLATELET
Basophils Absolute: 0 10*3/uL (ref 0.0–0.1)
EOS%: 2.1 % (ref 0.0–7.0)
HCT: 39.3 % (ref 38.4–49.9)
HGB: 13.2 g/dL (ref 13.0–17.1)
MCH: 34.2 pg — ABNORMAL HIGH (ref 27.2–33.4)
MCV: 102.2 fL — ABNORMAL HIGH (ref 79.3–98.0)
NEUT%: 78.7 % — ABNORMAL HIGH (ref 39.0–75.0)
lymph#: 0.4 10*3/uL — ABNORMAL LOW (ref 0.9–3.3)

## 2011-09-01 NOTE — Discharge Summary (Signed)
NAMEBRENTON, Jacobs              ACCOUNT NO.:  [id number]  MEDICAL RECORD NO.:  [id number]  LOCATION:  1442                         FACILITY:  [facility]  PHYSICIAN:  Marcellus Scott, MD     DATE OF BIRTH:  October 14, 1935  DATE OF ADMISSION:  08/14/2011 DATE OF DISCHARGE:  08/20/2011                              DISCHARGE SUMMARY   PRIMARY CARE PHYSICIAN:  Brendan Gens A. Tawanna Cooler, MD  ONCOLOGIST:  Ladene Artist, M.D.  This is an addendum to the detailed discharge summary that was done by Dr. Hartley Barefoot on August 18, 2011.  This summary will update events since then.  DISCHARGE DIAGNOSES: 1. Hypokalemia, repleted. 2. Pancytopenia, secondary to chemoradiation. 3. Abdominal pain secondary to pancreatic cancer. 4. An episode of nonsustained ventricular tachycardia.  DISCHARGE MEDICATIONS: 1. Resource Peach liquid 240 mL p.o. b.i.d. between meals. 2. Senna 2 tablets p.o. daily q.h.s. p.r.n. for constipation. 3. Nitroglycerin 0.4 mg sublingual q.5 minutes p.r.n. for chest pain. 4. Ondansetron 8 mg p.o. q.8 hourly p.r.n. nausea. 5. Oxycodone 5 mg p.o. q.4 to 6 hourly p.r.n. pain. 6. Zantac 150 mg p.o. b.i.d.  DISCONTINUED MEDICATIONS: 1. Prednisone. 2. Chemotherapy regimen.  IMAGING:  Since the September 4, none.  LABORATORY DATA:  Blood cultures x2 from August 14, 2011, are no growth.  CBC:  Hemoglobin 11.1, hematocrit 33, white blood cell 2.8, platelets 117, 66% neutrophils with absolute neutrophil count of 1900, magnesium is 1.7.  Basic metabolic panel: Sodium 134 and glucose 107, otherwise within normal limits.  IMAGING:  Please include a 2-D echocardiogram, left ventricle cavity size was normal.  Wall thickness was normal.  Left ventricular ejection fraction 50%-55%.  Regional wall motion abnormalities cannot be excluded.  Septal motion showed dyssynergy.  These changes are consistent with right ventricular pacing.  Mild mitral regurgitation, mild-to-moderate tricuspid  regurgitation.  CONSULTATIONS:  Oncology, Ladene Artist, M.D.  DIET:  Soft diet.  ACTIVITY:  Increase activity slowly.  TODAY'S COMPLAINTS:  The patient indicates that he has mild intermittent midabdominal pain, which seems to be chronic.  He has had a loose BM today.  His appetite is progressively increasing and he tolerated some mashed potatoes today.  PHYSICAL EXAMINATION:  GENERAL: The patient is in no obvious distress. VITAL SIGNS:  Temperature 98.9 degrees Fahrenheit, pulse 77 per minute, respiration 16 per minute, blood pressure 106/68 mmHg, and saturating at 99% on room air. RESPIRATORY SYSTEM: Clear. CARDIOVASCULAR SYSTEM: S1 and S2 heart sounds heard regular. ABDOMEN:  Nondistended, soft, mild midabdominal tenderness with no rigidity, guarding, or rebound.  Normal bowel sounds heard. CENTRAL NERVOUS SYSTEM: Patient is awake, alert, oriented x3 with no focal neurological deficits.  HOSPITAL COURSE:  Since September 4th, the patient was started on diet which has been gradually advanced.  He continues to have some midabdominal pain, which is likely secondary to his pancreatic cancer. He has had a BM today.  He has ambulated in the room.  His potassium was 3.3 yesterday, which has been repleted.  The oncologist saw him yesterday and indicated that he has mild pancytopenia secondary to chemoradiation and they will schedule a follow up appointment for the week of the September 10.  The  patient, at this time, is stable to discharge home. The patient carries a history of atrial fibrillation, but is not a Coumadin candidate and the patient is also not on aspirin, possibly secondary to his thrombocytopenia and reluctant to continue same at this time secondary to recent active GI issues and  risks of bleeding.  This may be considered at some point as an outpatient.   DISPOSITION:  The patient is discharged home in stable condition with home health physical therapy, occupational  therapy, and a rolling walker with 5-inch wheels.  FOLLOW UP RECOMMENDATIONS: 1. With Dr. Mancel Bale. 2. With Dr. Kelle Darting.    The patient is to call for these appointments.   Time taken in coordinating this discharge is 30 minutes.     Marcellus Scott, MD     AH/MEDQ  D:  08/20/2011  T:  08/20/2011  Job:  [id number]  cc:   Brendan Gens A. Tawanna Cooler, MD [address] [place] [address]  Ladene Artist, M.D. Fax: [phone]  Electronically Signed by Marcellus Scott MD on 09/01/2011 05:24:32 PM

## 2011-09-21 ENCOUNTER — Telehealth: Payer: Self-pay | Admitting: Family Medicine

## 2011-09-21 NOTE — Telephone Encounter (Signed)
Pt.'s son(Glen Ether Griffins) would like to talk to you about his father who has cancer and is in Hospice.

## 2011-10-01 NOTE — Telephone Encounter (Signed)
See other phone message  

## 2011-11-07 IMAGING — CT CT ABD-PELV W/ CM
2 of 5 series · 12 of 32 positions shown, 17 images · IV contrast (omnipaque)
Comparison: 09/03/2009 CT.

***ADDENDUM*** CREATED: 07/01/2011 [DATE]

Initial report by Dr. Jaekyu.  Addendum by Dr. Gerrit. I presented
this case at tumor conference this morning.  Upon review, there is
a suspicious area of omental nodularity just deep to the
umbilicus.  This measures 1.5 x 1.0 cm on image 43 of series 2.
Omental/peritoneal metastasis cannot be excluded.
CLINICAL DATA: Left-sided abdominal pain.  Prior partial large
bowel resection.
CT ABDOMEN AND PELVIS WITH CONTRAST
TECHNIQUE: Multidetector CT imaging of the abdomen and pelvis was
performed following the standard protocol during bolus
administration of intravenous contrast.
Contrast: 80 ml Omnipaque.

[Series 2: routine abdomen · axial · 0.81mm/px · z∈[-495,-165]mm · 7 of 89 slices shown, 12 images]
[im 12/89  soft-tissue]
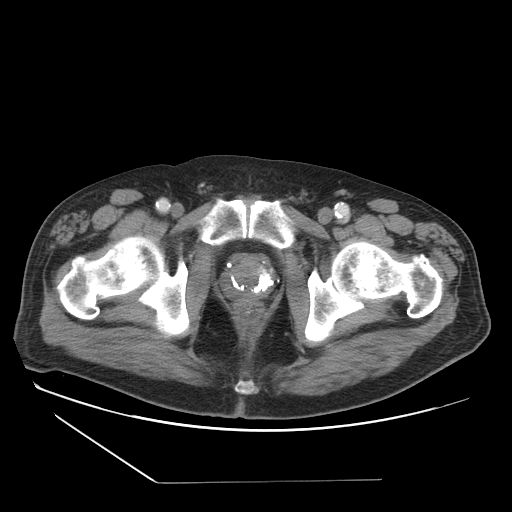
[im 12/89  bone]
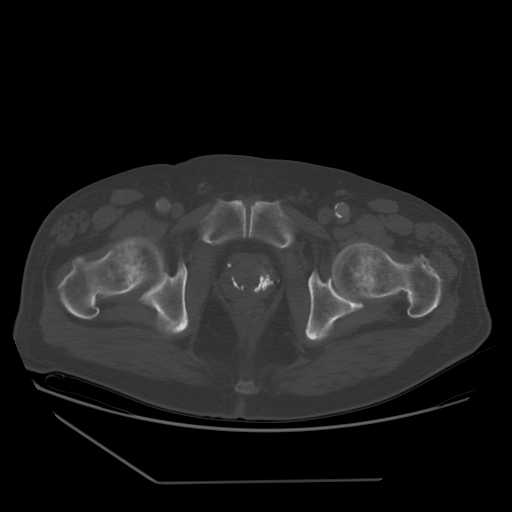
[im 23/89  soft-tissue]
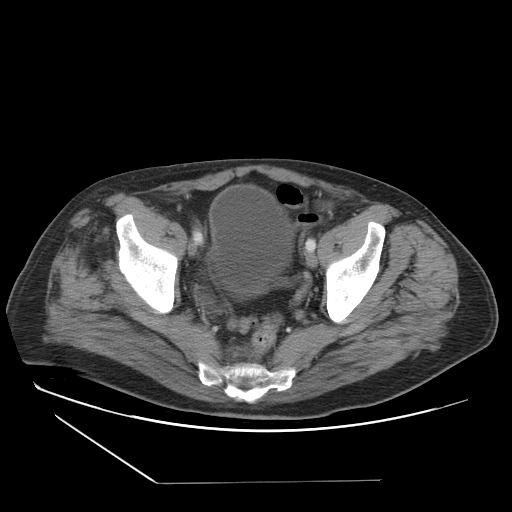
[im 34/89  soft-tissue]
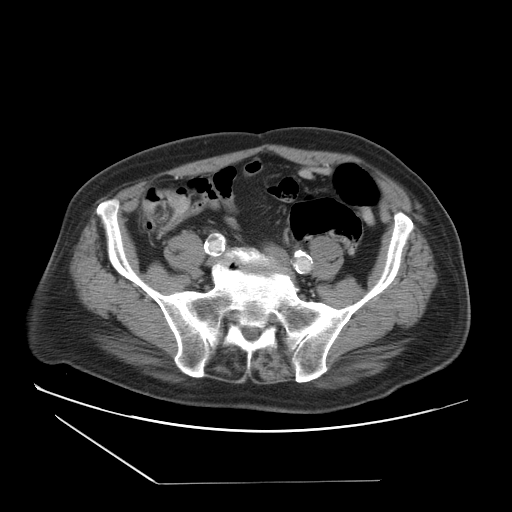
[im 45/89  soft-tissue]
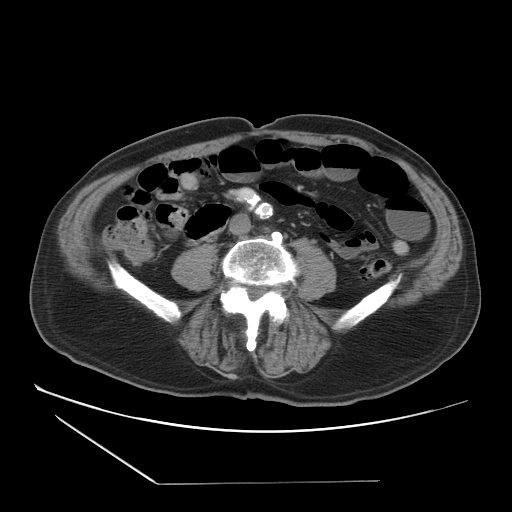
[im 45/89  lung]
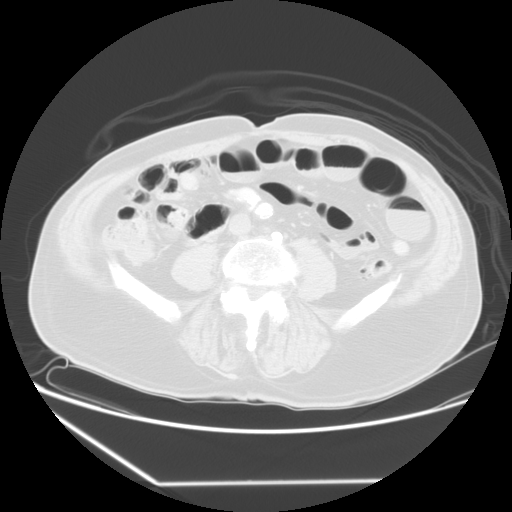
[im 56/89  soft-tissue]
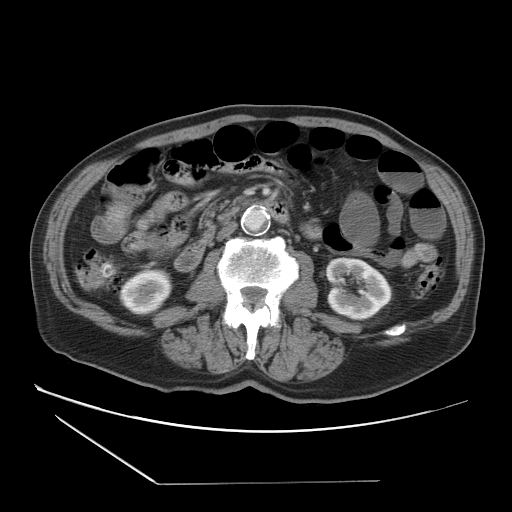
[im 56/89  lung]
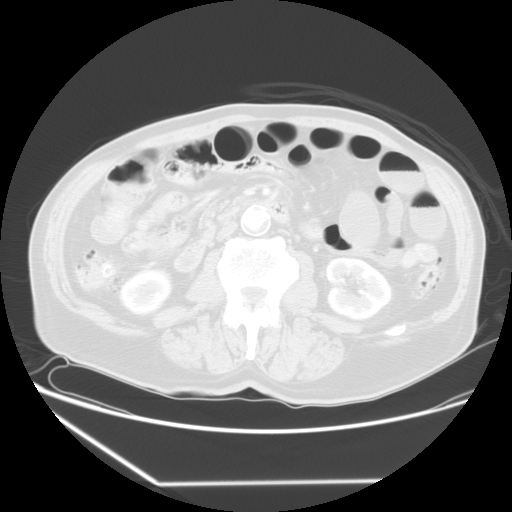
[im 67/89  soft-tissue]
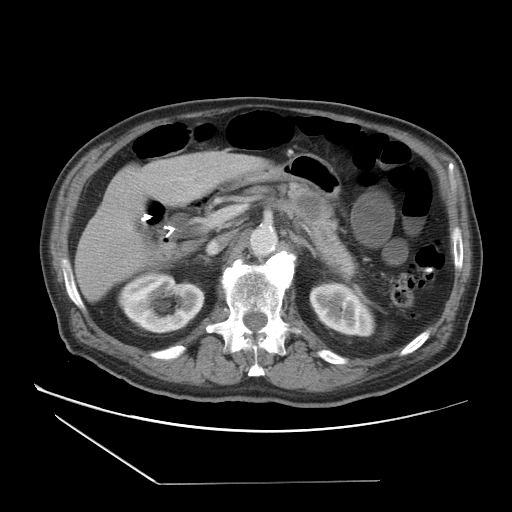
[im 67/89  lung]
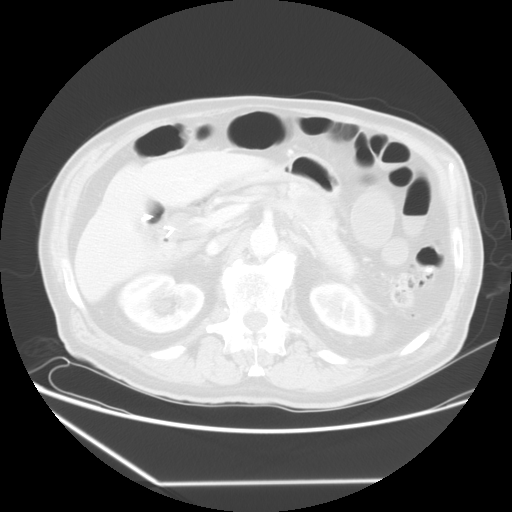
[im 78/89  soft-tissue]
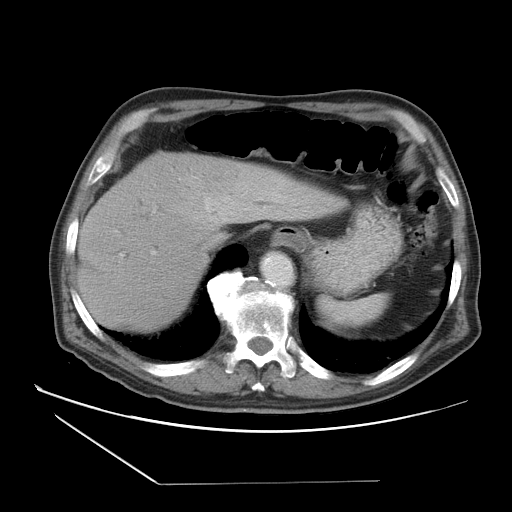
[im 78/89  lung]
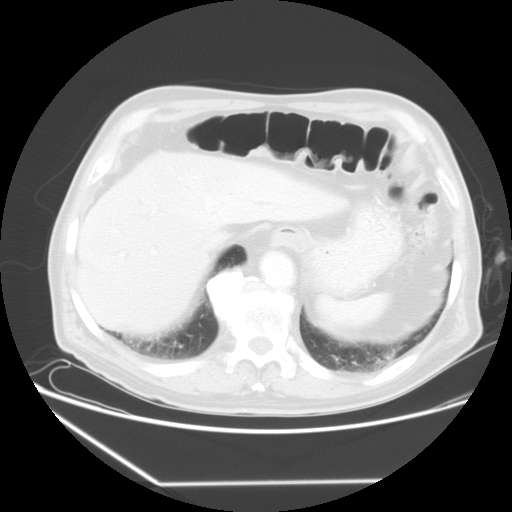

[Series 401: sag · sagittal · 0.88mm/px · 5 of 113 slices shown]
[im 12/113  soft-tissue]
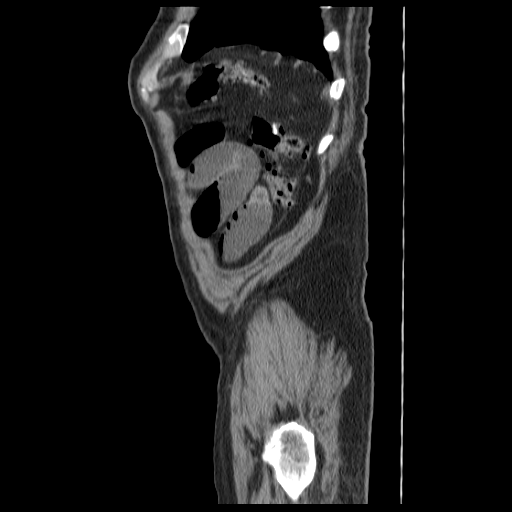
[im 23/113  soft-tissue]
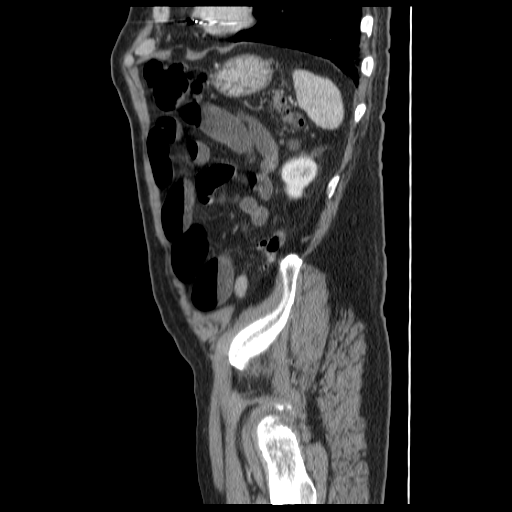
[im 34/113  soft-tissue]
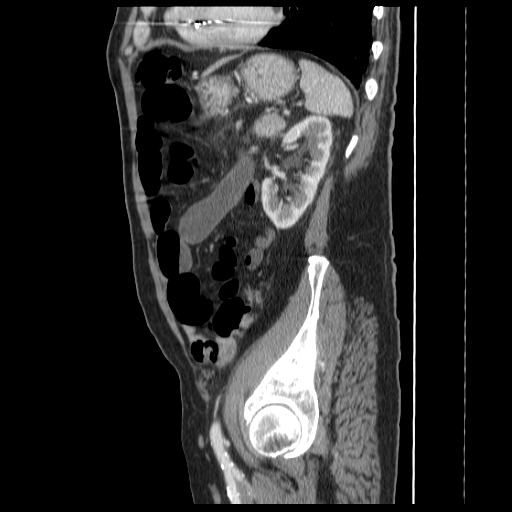
[im 45/113  soft-tissue]
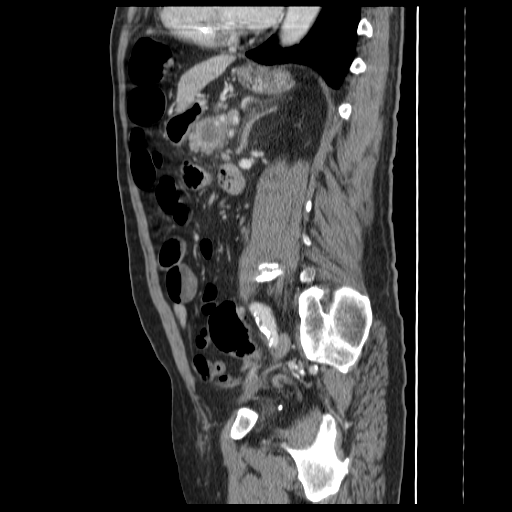
[im 68/113  soft-tissue]
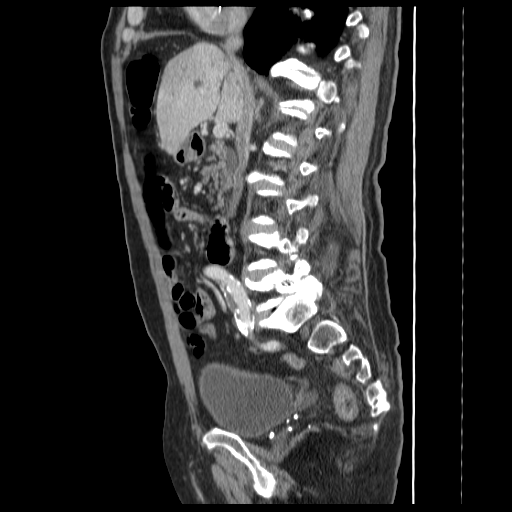

[12 of 32 positions shown; findings below may reference images not displayed]

FINDINGS: New since the prior examination is a slightly complex
x 2.3 x 2.9 cm lesion within the pancreatic tail/neck junction.
Predominantly low density centrally with peripheral enhancement.
Tumor needs to be excluded although this may represent result of
inflammation/infection.

Scattered surrounding lymph nodes are new since prior exam in the
peripancreatic region and as well as extending along the left
lateral aspect of the celiac artery. Mild dilation pancreatic duct
and minimal infiltration of the remainder of the pancreatic tail
region.

Dilated intrahepatic bile ducts without significant change.  No
focal hepatic lesion.  Post cholecystectomy.  No calcified common
bile duct stone or pancreatic head mass identified.

Left renal cysts some of which are too small to adequately
characterize but without significant change.  No hydronephrosis.
No focal splenic, adrenal or right renal lesion.

Pacemaker is in place.  Heart size top normal.

Atherosclerotic type changes aorta and branch vessels with
prominent plaque mid to lower abdominal aorta without aneurysmal
dilation.  Dilated right common iliac artery with significant
complex plaque measuring up to 1.9 cm.  Narrowing of the femoral
arteries, common iliac arteries, renal arteries, celiac artery and
superior mesenteric artery.

Prominent calcification and prostate gland.  Right sided bladder
diverticulum once again noted.  Noncontrast filled views of the
bladder otherwise unremarkable.

Scattered colonic diverticula most notable descending colon without
extraluminal inflammation.

Fluid and gas filled prominent sized proximal to mid small bowel
loops without point of obstruction identified and may represent the
presence of mild ileus.  Attention to this on follow-up.

Degenerative changes most notable L5-S1.

No free intraperitoneal air.  Scarring lung bases.
IMPRESSION: New since the prior examination is a slightly complex 2.6 x 2.3 x
2.9 cm lesion within the pancreatic tail/neck junction.  Tumor
needs to be excluded although this may represent result of
inflammation/infection.

Scattered colonic diverticula most notable descending colon without
extraluminal inflammation.

Fluid and gas filled prominent sized proximal to mid small bowel
loops without point of obstruction identified and may represent the
presence of mild ileus.  Attention to this on follow-up.

Prominent atherosclerotic type changes as noted above.

Critical test results telephoned to Dr. Shanell at the time of
interpretation on 05/21/2011 at [DATE] p.m.

## 2011-11-10 ENCOUNTER — Telehealth: Payer: Self-pay | Admitting: Internal Medicine

## 2011-11-14 NOTE — Progress Notes (Signed)
Received call from Elijah Birk, hospice RN, stating that pt. Expired today and was pronounced at 2:36 pm.  This note given to desk RN/MD.

## 2011-11-14 DEATH — deceased

## 2012-02-01 IMAGING — CR DG ABDOMEN 2V
1 series · 3 of 3 positions shown · non-contrast
Comparison: CT of 1 day prior

CLINICAL DATA: Follow up of small bowel obstruction.

ABDOMEN - 2 VIEW

[Series 1: ap (kub) · U · 3 of 3 slices shown]
[im 1/3]
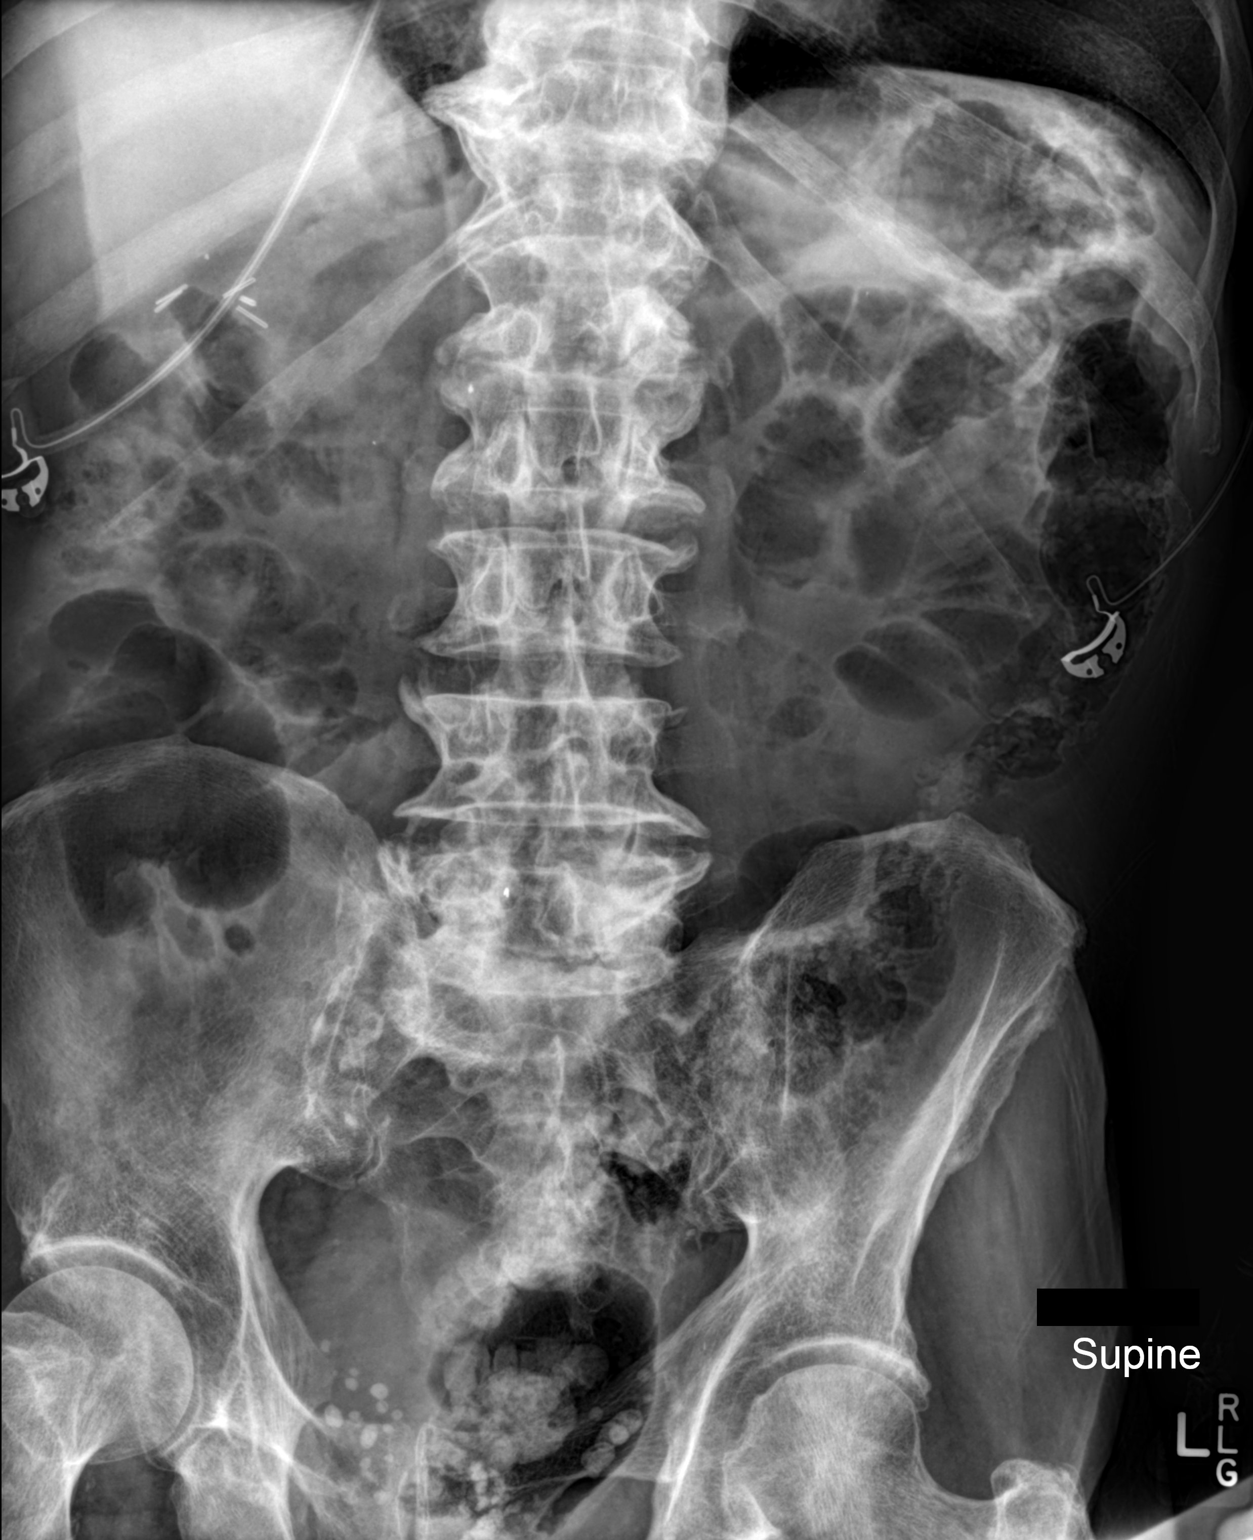
[im 2/3]
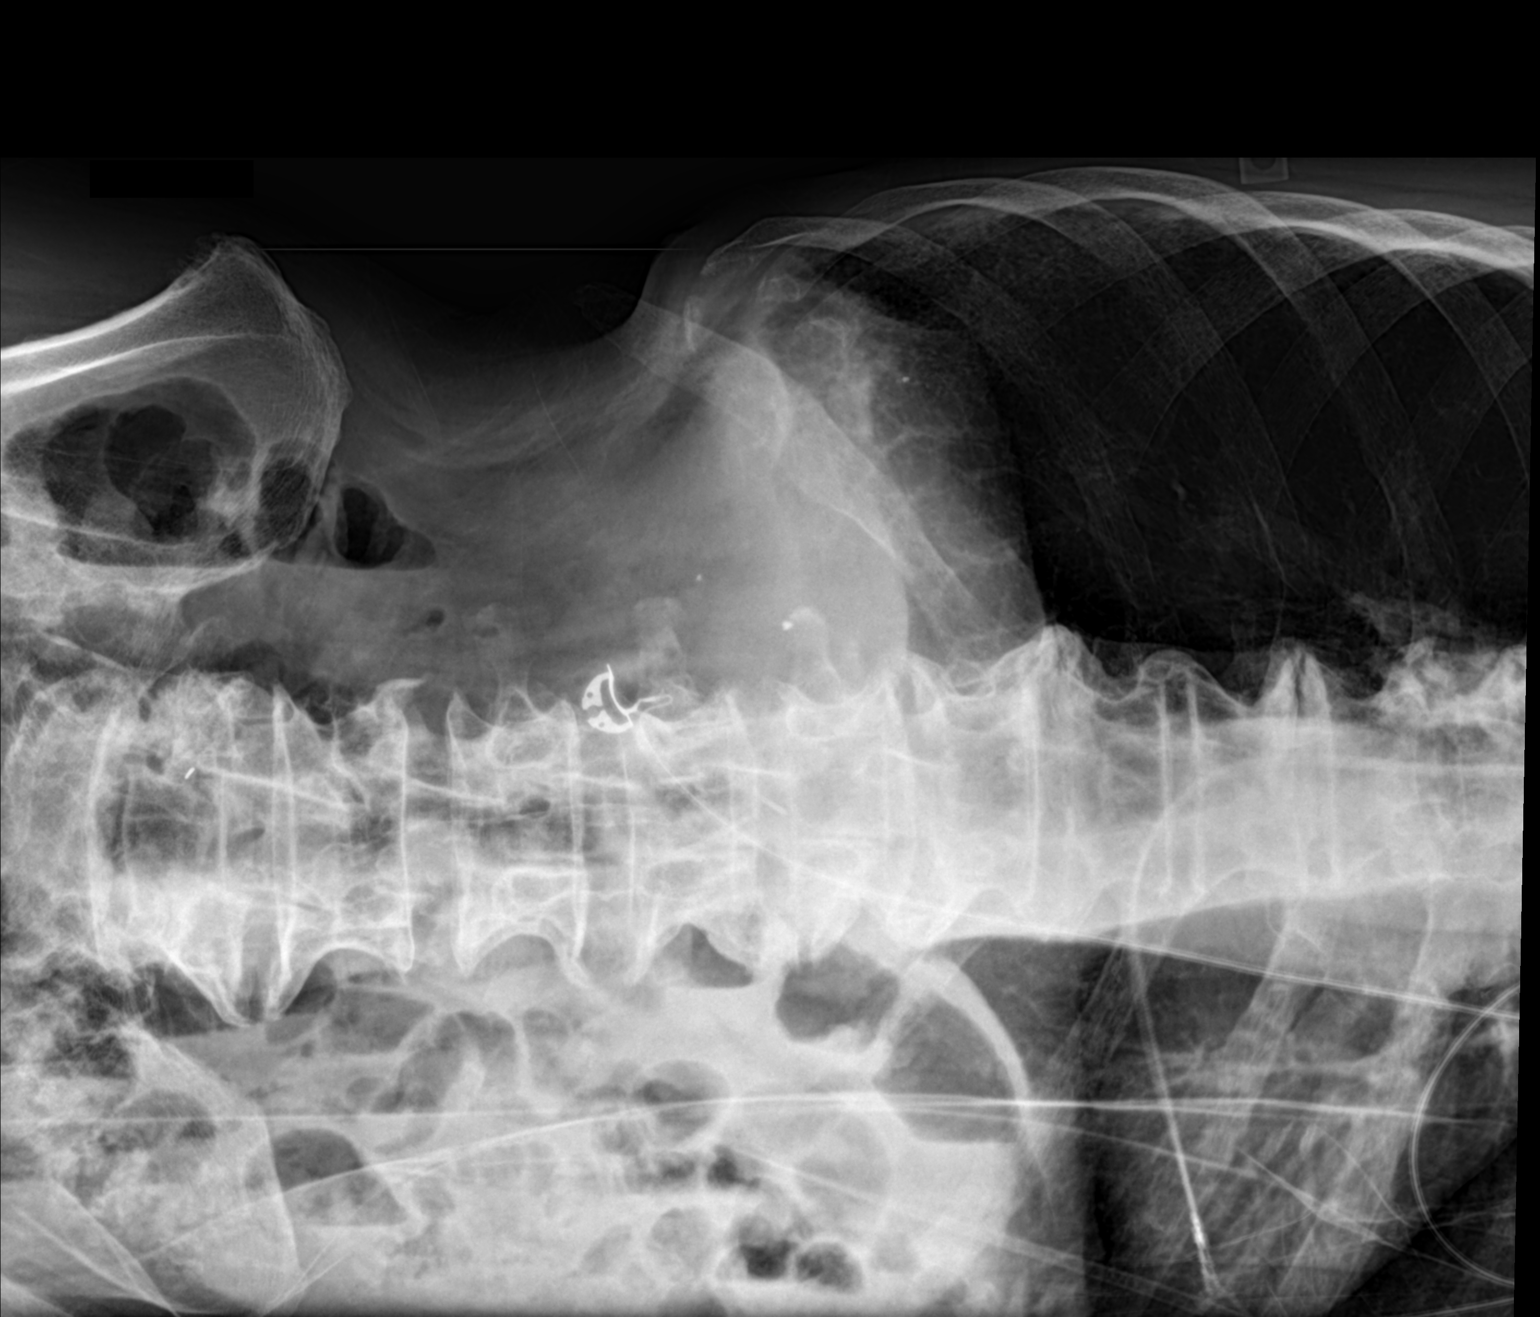
[im 3/3]
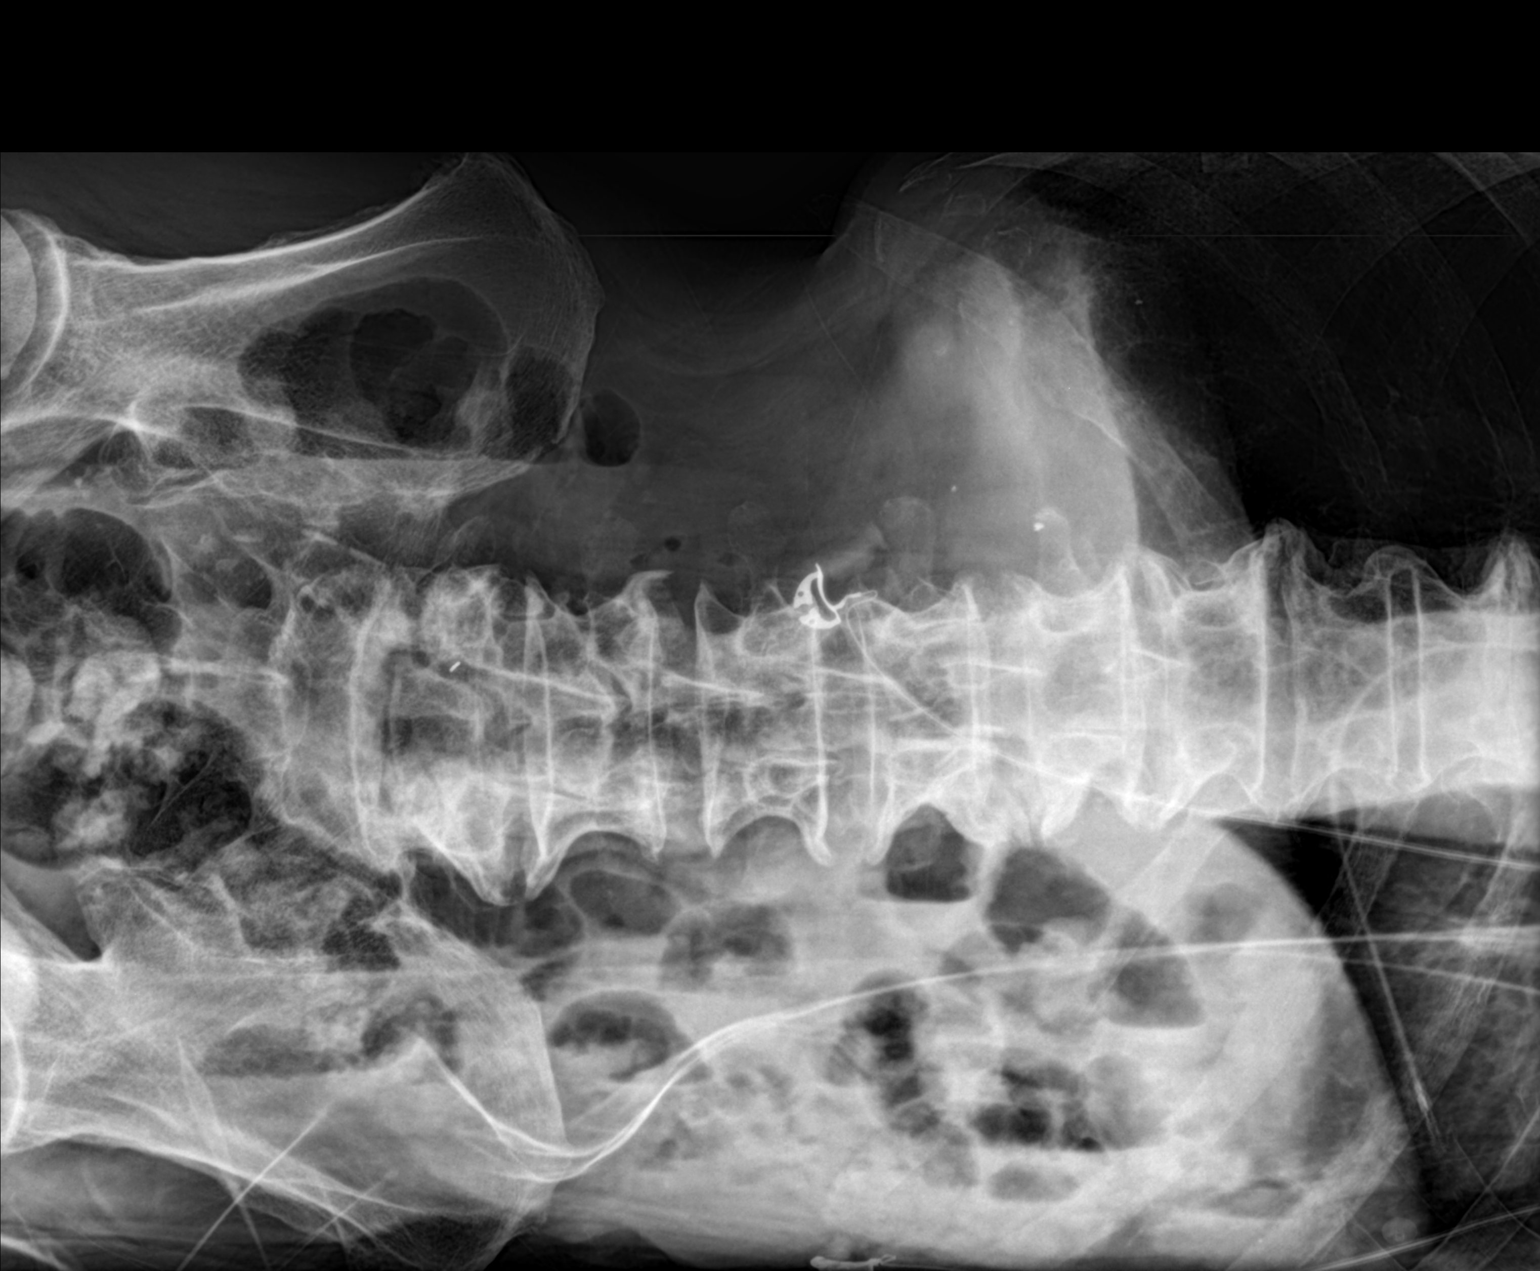

[3 of 3 positions shown; findings below may reference images not displayed]

FINDINGS: Supine view demonstrates improved to resolved small bowel
dilatation.  Maximally 2.7 cm.  Decubitus views demonstrate no free
intraperitoneal air.  Air fluid levels within small bowel loops are
identified. Phleboliths in the pelvis.  Rectal gas.
IMPRESSION: Improved to resolved small bowel obstruction.

## 2012-07-08 ENCOUNTER — Telehealth: Payer: Self-pay | Admitting: Internal Medicine

## 2012-07-08 NOTE — Telephone Encounter (Signed)
07-08-12 called pt # d/c, called and lm for son glen to call to get pt set up for past due pacer ck, taylor or brooke/mt
